# Patient Record
Sex: Male | Born: 1955
Health system: Midwestern US, Community
[De-identification: ages and names within clinical notes are randomized; demographics above are authoritative.]

## PROBLEM LIST (undated history)

## (undated) DIAGNOSIS — M109 Gout, unspecified: Secondary | ICD-10-CM

## (undated) DIAGNOSIS — E079 Disorder of thyroid, unspecified: Secondary | ICD-10-CM

## (undated) DIAGNOSIS — I10 Essential (primary) hypertension: Secondary | ICD-10-CM

## (undated) HISTORY — DX: Disorder of thyroid, unspecified: E07.9

## (undated) HISTORY — DX: Gout, unspecified: M10.9

## (undated) HISTORY — DX: Essential (primary) hypertension: I10

## (undated) HISTORY — PX: APPENDECTOMY: SHX54

---

## 2009-04-22 LAB — DRUG SCREEN UR - NO CONFIRM
ACETAMINOPHEN: NEGATIVE
AMPHETAMINES: NEGATIVE
BARBITURATES: NEGATIVE
BENZODIAZEPINES: NEGATIVE
COCAINE: POSITIVE — AB
METHADONE: NEGATIVE
Methamphetamines: NEGATIVE
OPIATES: NEGATIVE
PCP(PHENCYCLIDINE): NEGATIVE
THC (TH-CANNABINOL): POSITIVE — AB
TRICYCLICS: NEGATIVE

## 2017-04-18 ENCOUNTER — Ambulatory Visit (HOSPITAL_COMMUNITY)
Admission: RE | Admit: 2017-04-18 | Discharge: 2017-04-18 | Disposition: A | Discharge status: Home / Self Care | Payer: Medicare HMO | Source: Ambulatory Visit | Attending: Family Medicine | Admitting: Family Medicine

## 2017-04-18 ENCOUNTER — Other Ambulatory Visit: Payer: Self-pay

## 2017-04-18 ENCOUNTER — Ambulatory Visit: Payer: Medicare HMO | Admitting: Family Medicine

## 2017-04-18 ENCOUNTER — Encounter: Payer: Self-pay | Admitting: Family Medicine

## 2017-04-18 VITALS — BP 140/80 | HR 107 | Temp 98.3°F | Resp 16 | Ht 70.0 in | Wt 145.2 lb

## 2017-04-18 DIAGNOSIS — J189 Pneumonia, unspecified organism: Secondary | ICD-10-CM

## 2017-04-18 DIAGNOSIS — R5383 Other fatigue: Secondary | ICD-10-CM

## 2017-04-18 DIAGNOSIS — E0590 Thyrotoxicosis, unspecified without thyrotoxic crisis or storm: Secondary | ICD-10-CM | POA: Diagnosis not present

## 2017-04-18 DIAGNOSIS — R7989 Other specified abnormal findings of blood chemistry: Secondary | ICD-10-CM | POA: Diagnosis not present

## 2017-04-18 DIAGNOSIS — Z79899 Other long term (current) drug therapy: Secondary | ICD-10-CM | POA: Diagnosis not present

## 2017-04-18 DIAGNOSIS — Z87891 Personal history of nicotine dependence: Secondary | ICD-10-CM | POA: Insufficient documentation

## 2017-04-18 DIAGNOSIS — Z7289 Other problems related to lifestyle: Secondary | ICD-10-CM

## 2017-04-18 DIAGNOSIS — M1A09X1 Idiopathic chronic gout, multiple sites, with tophus (tophi): Secondary | ICD-10-CM | POA: Diagnosis not present

## 2017-04-18 DIAGNOSIS — Z789 Other specified health status: Secondary | ICD-10-CM | POA: Diagnosis not present

## 2017-04-18 DIAGNOSIS — R634 Abnormal weight loss: Secondary | ICD-10-CM

## 2017-04-18 NOTE — Progress Notes (Signed)
Patient ID: Jeremy Durham, male    DOB: 06/22/1955, 61 y.o.   MRN: [id number]  Chief Complaint  Patient presents with  . Thyroid Problem    Allergies Patient has no known allergies.  Subjective:   Jeremy LoweMichael Durham is a 61 y.o. male who presents to [facility] today.  HPI Mr. Jeremy Fushomas presents today as a new patient visit.  He is accompanied by his wife and his daughter.  They report that they are transferring their care to our office because they have some concerns about the care he has been receiving.  Reports that many of their family members come to our office for treatment.  Wife reports that in August of this year that patient got pneumonia.  He reports that he lost weight and had a decreased appetite.  No chest x-ray was obtained but reports they were told that he had walking pneumonia.  Reports he was in the bed and sick and then eventually got better.  Reports that again in October, he got sick again with SOB, DOE, and had gout in feet, knee, arm.  They went back to the doctor and at that time the physician was concerned that he could have pneumonia and checked labs.  Was subsequently put on prednisone for a week. Went back to doctor and was told kidney was not good/thyroid was abnormal. Went back and got more blood work. Then told kidneys were okay and thyroid was still abnormal.  Were referred to endocrinologist for evaluation of thyroid and given appointment for July 2019.  Report that they came in today because they are worried about patient's overall health.  Reports that in less than a year that he has lost over 50 pounds.  Appetite has been lower than normal.  Patient does drink alcohol, and reports he drinks about a quart a day.  Denies any liquor use.  Denies any illicit drug use.  Is a prior tobacco smoker.  Used to drink heavier than he does now.  Asked about the color of his eyes patient and his family report that his eyes will get more yellow from time to time but  they are more yellow now used to be in the past.  Patient denies any cough.  Reports when he does yard work he does get a little winded from time to time.  Reports that he does eat about 2 meals a day but is not always hungry.  Denies any chest pain or swelling in his legs.   Review [facility] that are in chart reveal a suppressed TSH with all other normal thyroid functions.  Patient gives a significant history of gout in his past.  He currently takes allopurinol each day for gout.  He reports for gout attacks he is used steroids and colchicine in the past.  He reports he has had gout in his feet, knees, arms, hands, and ankles in the past.  Has never been on any other gout medications.  Has never seen a rheumatologist for his gout.  Reports that his joints will drain whitish discharge with the gout.  Tries not to eat beef or meat products because of his gout.  Patient reports that has deformities in his hands because of the gout.    Past Medical History:  Diagnosis Date  . Gout   . Hypertension   . Thyroid disease     Past Surgical History:  Procedure Laterality Date  . APPENDECTOMY  Family History  Problem Relation Age of Onset  . Hypertension Mother   . Hypertension Father   . Hypertension Sister   . Diabetes Sister   . Hypertension Daughter   . Hypertension Sister   . Hypertension Sister   . Hypertension Sister      Social History   Socioeconomic History  . Marital status: Married    Spouse name: None  . Number of children: None  . Years of education: None  . Highest education level: None  Social Needs  . Financial resource strain: None  . Food insecurity - worry: None  . Food insecurity - inability: None  . Transportation needs - medical: None  . Transportation needs - non-medical: None  Occupational History  . None  Tobacco Use  . Smoking status: Former Smoker    Packs/day: 1.00    Years: 15.00    Pack years: 15.00  . Smokeless  tobacco: Never Used  Substance and Sexual Activity  . Alcohol use: Yes  . Drug use: No  . Sexual activity: None  Other Topics Concern  . None  Social History Narrative   Grew up in [place].   Married.   One daughter.    On disability for gout.    Wear seatbelt.   Drives.    Review of Systems  Constitutional: Positive for appetite change, fatigue and unexpected weight change. Negative for activity change, chills, diaphoresis and fever.  HENT: Negative for congestion, sinus pain, trouble swallowing and voice change.   Eyes: Negative for visual disturbance.  Respiratory: Negative for apnea, cough, choking, chest tightness, wheezing and stridor.   Cardiovascular: Negative for chest pain, palpitations and leg swelling.  Gastrointestinal: Negative for abdominal distention, abdominal pain, anal bleeding, blood in stool, constipation, diarrhea and nausea.  Endocrine: Negative for polydipsia, polyphagia and polyuria.  Genitourinary: Negative for dysuria, frequency, hematuria and urgency.  Musculoskeletal: Positive for arthralgias. Negative for neck pain and neck stiffness.  Skin: Negative for rash.  Allergic/Immunologic: Negative for immunocompromised state.  Neurological: Negative for dizziness, tremors, seizures, syncope, speech difficulty, weakness and light-headedness.  Hematological: Negative for adenopathy. Does not bruise/bleed easily.  Psychiatric/Behavioral: Negative for behavioral problems, dysphoric mood, sleep disturbance and suicidal ideas.     Objective:   BP 140/80 (BP Location: Left Arm, Patient Position: Sitting, Cuff Size: Normal)   Pulse (!) 107   Temp 98.3 F (36.8 C) (Other (Comment))   Resp 16   Ht 5\' 10"  (1.778 m)   Wt 145 lb 4 oz (65.9 kg)   SpO2 97%   BMI 20.84 kg/m   Physical Exam  Constitutional: He is oriented to person, place, and time. He appears well-developed. No distress.  Thin African-American male with poor dentition.  Speech hard to  understand at times.  HENT:  Head: Normocephalic and atraumatic.  Nose: Nose normal.  Mouth/Throat: Oropharynx is clear and moist. No oropharyngeal exudate.  Eyes: EOM are normal. Pupils are equal, round, and reactive to light.  Muddy sclera, uncertain as to whether is icteric or patient's normal.  Neck: Normal range of motion. Neck supple. No JVD present. No tracheal deviation present. No thyromegaly present.  Cardiovascular: Normal rate, regular rhythm, normal heart sounds and intact distal pulses.  Heart rate 86  Pulmonary/Chest: Effort normal and breath sounds normal. No respiratory distress. He has no wheezes.  Abdominal: Soft. Bowel sounds are normal. He exhibits no distension. There is no tenderness. There is no guarding.  Scar on right upper quadrant.  Thin abdomen.  No hepatosplenomegaly palpated.  Musculoskeletal: Normal range of motion. He exhibits no edema.  Patient with bilateral hand PIP and DIP joint swelling and deformities.  Left knee swelling and effusion present.  Lymphadenopathy:    He has no cervical adenopathy.  Neurological: He is alert and oriented to person, place, and time. No cranial nerve deficit.  Skin: Skin is warm and dry. He is not diaphoretic.  Psychiatric: He has a normal mood and affect. His behavior is normal. Judgment and thought content normal.     Assessment and Plan   1. Fatigue, unspecified type Uncertain etiology whether related anemia or other metabolic disorder.  Check labs. - CBC with Differential/Platelet  2. Weight loss Uncertain at this time with her weight loss is secondary to inadequate caloric intake versus hypermetabolic state like thyroid, recovering from pneumonia versus possible malignancy or infectious disease.  Check labs and other workup as indicated.  Patient asked to keep a diet diary. - CBC with Differential/Platelet - COMPLETE METABOLIC PANEL WITH GFR - Hepatitis panel, acute - HIV antibody  3. Alcohol use She has cut  down on his alcohol use.  Recommend he start a multivitamin each day as directed.  Alcohol abstinence for his overall health was encouraged.  He gives no history that if he does not drink he gets shaky or has side effects.  Can go days without drinking.  Check labs. - Vitamin B12 - RBC Folate Patient with history of intermittent scleral icterus, and alcohol use.  Check liver function at this time. 4. Elevated serum creatinine Notes reviewed in chart reported elevated serum creatinine, after talking with patient questionable in the setting of acute renal failure secondary to dehydration when he was sick.  Check labs today. - Basic metabolic panel  5. Idiopathic chronic gout of multiple sites with tophus Patient was significant tophaceous gout at multiple sites and arthritic changes as a result.  Continue allopurinol.  Information given on low purine diet.  Counseled to decrease alcohol use. Family and patient defer referral to rheumatology at this time but would rather wait on other lab test and follow-up. 6. High risk medication use   7. Pneumonia due to infectious organism, unspecified laterality, unspecified part of lung Patient with reported episodes of pneumonia and significant tobacco history.  Check chest x-ray at this time for resolution of pneumonia.  In addition will discuss CT scan of the chest to screen for lung cancer pending other lab tests and x-ray. - DG Chest 2 View; Future  8. Hyperthyroidism Lab values discussed with patient and family that were in chart.  We will check these additional tests today since it is been approximately 2 months and testing was done. - TSH - T3, free - T4  Patient requests that we call his wife, Ricki MillerLinda Poellnitz,  [phone]/ [phone] with lab test results. Office visit was greater than 45 minutes.  Greater than 50% of office visit spent counseling and coordinating care. Patient defers pneumonia and influenza vaccinations.  Counseled concerning  the benefits of these immunizations. Return in about 3 weeks (around 05/09/2017) for Follow-up. Aliene Beamsachel Hagler, MD 04/18/2017

## 2017-04-18 NOTE — Patient Instructions (Signed)

## 2017-04-19 LAB — HEPATITIS PANEL, ACUTE
HEP B S AG: NONREACTIVE
Hep A IgM: NONREACTIVE
Hep B C IgM: NONREACTIVE
Hepatitis C Ab: NONREACTIVE
SIGNAL TO CUT-OFF: 0.02 (ref <1.00)

## 2017-04-19 LAB — CBC WITH DIFFERENTIAL/PLATELET
BASOS PCT: 0.6 %
Basophils Absolute: 43 cells/uL (ref 0–200)
EOS PCT: 1.5 %
Eosinophils Absolute: 108 cells/uL (ref 15–500)
HEMATOCRIT: 32.5 % — AB (ref 38.5–50.0)
HEMOGLOBIN: 11 g/dL — AB (ref 13.2–17.1)
LYMPHS ABS: 2362 {cells}/uL (ref 850–3900)
MCH: 29.5 pg (ref 27.0–33.0)
MCHC: 33.8 g/dL (ref 32.0–36.0)
MCV: 87.1 fL (ref 80.0–100.0)
MONOS PCT: 11.3 %
MPV: 8.9 fL (ref 7.5–12.5)
NEUTROS ABS: 3874 {cells}/uL (ref 1500–7800)
Neutrophils Relative %: 53.8 %
Platelets: 213 10*3/uL (ref 140–400)
RBC: 3.73 10*6/uL — ABNORMAL LOW (ref 4.20–5.80)
RDW: 17.8 % — ABNORMAL HIGH (ref 11.0–15.0)
Total Lymphocyte: 32.8 %
WBC mixed population: 814 cells/uL (ref 200–950)
WBC: 7.2 10*3/uL (ref 3.8–10.8)

## 2017-04-19 LAB — COMPLETE METABOLIC PANEL WITH GFR
AG Ratio: 1 (calc) (ref 1.0–2.5)
ALBUMIN MSPROF: 3.7 g/dL (ref 3.6–5.1)
ALKALINE PHOSPHATASE (APISO): 77 U/L (ref 40–115)
ALT: 5 U/L — ABNORMAL LOW (ref 9–46)
AST: 21 U/L (ref 10–35)
BUN / CREAT RATIO: 7 (calc) (ref 6–22)
BUN: 9 mg/dL (ref 7–25)
CALCIUM: 9.2 mg/dL (ref 8.6–10.3)
CO2: 22 mmol/L (ref 20–32)
CREATININE: 1.28 mg/dL — AB (ref 0.70–1.25)
Chloride: 104 mmol/L (ref 98–110)
GFR, EST AFRICAN AMERICAN: 69 mL/min/{1.73_m2} (ref >=60)
GFR, EST NON AFRICAN AMERICAN: 60 mL/min/{1.73_m2} (ref >=60)
GLOBULIN: 3.6 g/dL (ref 1.9–3.7)
GLUCOSE: 77 mg/dL (ref 65–99)
Potassium: 3.4 mmol/L — ABNORMAL LOW (ref 3.5–5.3)
Sodium: 141 mmol/L (ref 135–146)
TOTAL PROTEIN: 7.3 g/dL (ref 6.1–8.1)
Total Bilirubin: 0.5 mg/dL (ref 0.2–1.2)

## 2017-04-19 LAB — T3, FREE: T3, Free: 3.8 pg/mL (ref 2.3–4.2)

## 2017-04-19 LAB — HIV ANTIBODY (ROUTINE TESTING W REFLEX): HIV: NONREACTIVE

## 2017-04-19 LAB — VITAMIN B12: VITAMIN B 12: 314 pg/mL (ref 200–1100)

## 2017-04-19 LAB — T4: T4, Total: 9.5 ug/dL (ref 4.9–10.5)

## 2017-04-19 LAB — FOLATE RBC: RBC Folate: 629 ng/mL RBC (ref >280)

## 2017-04-19 LAB — TSH: TSH: 0.02 m[IU]/L — AB (ref 0.40–4.50)

## 2017-04-20 ENCOUNTER — Other Ambulatory Visit: Payer: Self-pay | Admitting: Family Medicine

## 2017-04-20 DIAGNOSIS — D649 Anemia, unspecified: Secondary | ICD-10-CM

## 2017-04-20 DIAGNOSIS — R634 Abnormal weight loss: Secondary | ICD-10-CM

## 2017-04-20 MED ORDER — POTASSIUM CHLORIDE ER 10 MEQ PO CPCR: 10.0000 meq | ORAL_CAPSULE | Freq: Two times a day (BID) | ORAL | 0 refills | Status: DC

## 2017-04-20 NOTE — Telephone Encounter (Signed)
Spoke to patient's wife. Informed her of all below. She will call back to schedule.

## 2017-04-20 NOTE — Telephone Encounter (Signed)
Please call and speak with patient's wife and advise:  1.  Patient is anemic with a hemoglobin of 11.  I have ordered some additional test.  He can return to the lab to get this done.  In addition I have placed referral to gastroenterology to check for bleeding source and to evaluate why he is loosing weight.   2.  Patient's potassium was low at 3.4.  I have called in a potassium medication that he will need to take on a daily basis and we will plan on rechecking his level of follow-up. I have ordered the potassium as a call in, b/c he did not have a pharmacy listed.    3.  Please advised that HIV and hepatitis A, B, and C testing were negative.  4.  Please advised that his B12 is low.  Start B12 1000 mcg a day over-the-counter.  5.  Thyroid tests are consistent with what we discussed at the office visit.  There is no indication for any additional thyroid tests at this time and we will plan on rechecking those in 6 months.   6.  Please tell patient's wife that this is a lot of information and if they would like to come in sooner for their scheduled visit to discuss and get the additional labs that this would be fine.  Janine Limboachel H. Tracie HarrierHagler, MD

## 2017-04-26 ENCOUNTER — Telehealth: Payer: Self-pay | Admitting: Family Medicine

## 2017-04-26 NOTE — Telephone Encounter (Signed)
Someone was going to call in some medicine to [facility] in [place]. Pt went to pick it up and it was not there --please advise [phone]

## 2017-04-27 MED ORDER — POTASSIUM CHLORIDE ER 10 MEQ PO CPCR: 10.0000 meq | ORAL_CAPSULE | Freq: Two times a day (BID) | ORAL | 0 refills | Status: AC

## 2017-04-27 NOTE — Telephone Encounter (Signed)
Per wife, potassium was not received by pharm. Resent

## 2017-04-27 NOTE — Telephone Encounter (Signed)
I don't see anything in note.

## 2017-04-27 NOTE — Telephone Encounter (Signed)
Please call patient's wife, her number is in the note, and find out what they are requesting and need. Thank you. Janine Limboachel H. Tracie HarrierHagler, MD

## 2017-04-28 ENCOUNTER — Telehealth: Payer: Self-pay | Admitting: Family Medicine

## 2017-04-28 NOTE — Telephone Encounter (Signed)
Verbal given 

## 2017-04-28 NOTE — Telephone Encounter (Signed)
Patient called in to let you know that [facility] does not have her medication that was sent in yesterday Cb#: [phone]

## 2017-05-11 ENCOUNTER — Ambulatory Visit (INDEPENDENT_AMBULATORY_CARE_PROVIDER_SITE_OTHER): Payer: Medicare HMO | Admitting: Internal Medicine

## 2017-05-11 ENCOUNTER — Encounter (INDEPENDENT_AMBULATORY_CARE_PROVIDER_SITE_OTHER): Payer: Self-pay | Admitting: Internal Medicine

## 2017-05-11 ENCOUNTER — Encounter (INDEPENDENT_AMBULATORY_CARE_PROVIDER_SITE_OTHER): Payer: Self-pay | Admitting: *Deleted

## 2017-05-11 VITALS — BP 180/90 | HR 120 | Temp 98.0°F | Ht 66.0 in | Wt 141.0 lb

## 2017-05-11 DIAGNOSIS — D508 Other iron deficiency anemias: Secondary | ICD-10-CM

## 2017-05-11 DIAGNOSIS — K7689 Other specified diseases of liver: Secondary | ICD-10-CM

## 2017-05-11 DIAGNOSIS — R634 Abnormal weight loss: Secondary | ICD-10-CM

## 2017-05-11 NOTE — Patient Instructions (Signed)
Labs and US abdomen.  

## 2017-05-11 NOTE — Progress Notes (Signed)
   Subjective:    Patient ID: Jeremy Durham, male    DOB: 09/28/1955, 61 y.o.   MRN: [id number]  HPI Referred by Dr. Tracie HarrierHagler for anemia/weight loss. He states he has lost about 40-60 pounds over a 5 yr period. He was [facility] for [facility] last year [facility] Saw Dr Tracie HarrierHagler in November and was noted that he sclera was icteric. He drinks about a quart of beer and sometimes he drinks liquor.  His wife states when he has the gout, his skin gets dark and his eyes get yellow.  His appetite is okay. No recent weight loss. Has a BM usually daily. No acid reflux. No abdominal pain.  Never had a colonoscopy in the past.  04/18/2017 Acute Hepatitis Panel negative.  Vitamin B12 314.   CBC    Component Value Date/Time   WBC 7.2 04/18/2017 1227   RBC 3.73 (L) 04/18/2017 1227   HGB 11.0 (L) 04/18/2017 1227   HCT 32.5 (L) 04/18/2017 1227   PLT 213 04/18/2017 1227   MCV 87.1 04/18/2017 1227   MCH 29.5 04/18/2017 1227   MCHC 33.8 04/18/2017 1227   RDW 17.8 (H) 04/18/2017 1227   LYMPHSABS 2,362 04/18/2017 1227   EOSABS 108 04/18/2017 1227   BASOSABS 43 04/18/2017 1227   CMP Latest Ref Rng & Units 04/18/2017  Glucose 65 - 99 mg/dL 77  BUN 7 - 25 mg/dL 9  Creatinine 4.090.70 - 8.111.25 mg/dL 9.14(N1.28(H)  Sodium 829135 - 562146 mmol/L 141  Potassium 3.5 - 5.3 mmol/L 3.4(L)  Chloride 98 - 110 mmol/L 104  CO2 20 - 32 mmol/L 22  Calcium 8.6 - 10.3 mg/dL 9.2  Total Protein 6.1 - 8.1 g/dL 7.3  Total Bilirubin 0.2 - 1.2 mg/dL 0.5  AST 10 - 35 U/L 21  ALT 9 - 46 U/L 5(L)     Review of Systems Past Medical History:  Diagnosis Date  . Gout   . Hypertension   . Thyroid disease     Past Surgical History:  Procedure Laterality Date  . APPENDECTOMY      No Known Allergies  Current Outpatient Medications on File Prior to Visit  Medication Sig Dispense Refill  . allopurinol (ZYLOPRIM) 300 MG tablet Take by mouth.    Marland Kitchen. amLODipine (NORVASC) 10 MG tablet Take by mouth.    . potassium  chloride (MICRO-K) 10 MEQ CR capsule Take 1 capsule (10 mEq total) by mouth 2 (two) times daily. 30 capsule 0   No current facility-administered medications on file prior to visit.         Objective:   Physical Exam Blood pressure (!) 180/90, pulse (!) 120, temperature 98 F (36.7 C), height 5\' 6"  (1.676 m), weight 141 lb (64 kg).  Alert and oriented. Skin warm and dry. Oral mucosa is moist.   . Sclera anicteric, conjunctivae is pink. Thyroid not enlarged. No cervical lymphadenopathy. Lungs clear. Heart regular rate and rhythm.  Abdomen is soft. Bowel sounds are positive. No hepatomegaly. No abdominal masses felt. No tenderness.  No edema to lower extremities.          Assessment & Plan:  Anemia. Am going to get iron studies. Will repeat an Hepatic function, AFP, PT/INR Jaundice: US abdomen After all are back, will set up for a colonoscopy. May need an EGD depending on iron studies.

## 2017-05-12 LAB — HEPATIC FUNCTION PANEL
AG Ratio: 1.1 (calc) (ref 1.0–2.5)
ALKALINE PHOSPHATASE (APISO): 63 U/L (ref 40–115)
ALT: 4 U/L — ABNORMAL LOW (ref 9–46)
AST: 16 U/L (ref 10–35)
Albumin: 3.6 g/dL (ref 3.6–5.1)
BILIRUBIN DIRECT: 0.1 mg/dL (ref 0.0–0.2)
BILIRUBIN TOTAL: 0.3 mg/dL (ref 0.2–1.2)
Globulin: 3.4 g/dL (calc) (ref 1.9–3.7)
Indirect Bilirubin: 0.2 mg/dL (calc) (ref 0.2–1.2)
Total Protein: 7 g/dL (ref 6.1–8.1)

## 2017-05-12 LAB — FERRITIN: Ferritin: 693 ng/mL — ABNORMAL HIGH (ref 20–380)

## 2017-05-12 LAB — PROTIME-INR
INR: 1
PROTHROMBIN TIME: 10.6 s (ref 9.0–11.5)

## 2017-05-12 LAB — AFP TUMOR MARKER: AFP-Tumor Marker: 1.6 ng/mL (ref <6.1)

## 2017-05-12 LAB — IRON, TOTAL/TOTAL IRON BINDING CAP
%SAT: 41 % (ref 15–60)
IRON: 74 ug/dL (ref 50–180)
TIBC: 181 ug/dL — AB (ref 250–425)

## 2017-05-15 ENCOUNTER — Other Ambulatory Visit: Payer: Self-pay | Admitting: Family Medicine

## 2017-05-15 NOTE — Telephone Encounter (Signed)
Patient was sent in #30 for BID dosing. Is patient to be on this medication more than 2 weeks?

## 2017-05-17 ENCOUNTER — Telehealth (INDEPENDENT_AMBULATORY_CARE_PROVIDER_SITE_OTHER): Payer: Self-pay | Admitting: Internal Medicine

## 2017-05-17 NOTE — Telephone Encounter (Signed)
Will schedule a colonoscopy once US abdomen is back. If he has cirrhosis may need and EGD with possible banding.

## 2017-05-18 NOTE — Telephone Encounter (Signed)
No.  He does not need to take this medicine for longer than 2 weeks.  He was supposed to follow-up and have his potassium checked when he comes back to the office.  Please advised him to keep her scheduled follow-up.

## 2017-05-25 ENCOUNTER — Ambulatory Visit (HOSPITAL_COMMUNITY): Payer: Medicare HMO

## 2017-05-29 ENCOUNTER — Telehealth (INDEPENDENT_AMBULATORY_CARE_PROVIDER_SITE_OTHER): Payer: Self-pay | Admitting: Internal Medicine

## 2017-05-29 NOTE — Telephone Encounter (Signed)
r 

## 2017-06-15 ENCOUNTER — Telehealth (INDEPENDENT_AMBULATORY_CARE_PROVIDER_SITE_OTHER): Payer: Self-pay | Admitting: Internal Medicine

## 2017-06-15 ENCOUNTER — Ambulatory Visit (HOSPITAL_COMMUNITY)
Admission: RE | Admit: 2017-06-15 | Discharge: 2017-06-15 | Disposition: A | Discharge status: Home / Self Care | Payer: Medicare HMO | Source: Ambulatory Visit | Attending: Internal Medicine | Admitting: Internal Medicine

## 2017-06-15 DIAGNOSIS — R17 Unspecified jaundice: Secondary | ICD-10-CM | POA: Insufficient documentation

## 2017-06-15 DIAGNOSIS — D508 Other iron deficiency anemias: Secondary | ICD-10-CM | POA: Insufficient documentation

## 2017-06-15 DIAGNOSIS — K7689 Other specified diseases of liver: Secondary | ICD-10-CM

## 2017-06-15 NOTE — Telephone Encounter (Signed)
I asked Jeremy Durham to come by the office and pick up stool cards. She was told to collect on 3 different days.

## 2017-06-26 ENCOUNTER — Telehealth (INDEPENDENT_AMBULATORY_CARE_PROVIDER_SITE_OTHER): Payer: Self-pay | Admitting: *Deleted

## 2017-06-26 ENCOUNTER — Telehealth (INDEPENDENT_AMBULATORY_CARE_PROVIDER_SITE_OTHER): Payer: Self-pay | Admitting: Internal Medicine

## 2017-06-26 NOTE — Telephone Encounter (Signed)
No answer. Will try later

## 2017-06-26 NOTE — Telephone Encounter (Signed)
The only phone number I see in this chart for this patient is [phone].   Daughter's number is [phone]

## 2017-06-26 NOTE — Telephone Encounter (Signed)
I have talked with wife. She will let me know when to schedule colonoscopy.

## 2017-06-26 NOTE — Telephone Encounter (Signed)
I have talked with wife. She is going to check on her insurance before a colonoscopy is scheduled. She will call me back and let me know when we can schedule.

## 2017-07-05 ENCOUNTER — Telehealth (INDEPENDENT_AMBULATORY_CARE_PROVIDER_SITE_OTHER): Payer: Self-pay | Admitting: Internal Medicine

## 2017-07-05 NOTE — Telephone Encounter (Signed)
err

## 2017-07-24 ENCOUNTER — Telehealth (INDEPENDENT_AMBULATORY_CARE_PROVIDER_SITE_OTHER): Payer: Self-pay | Admitting: Internal Medicine

## 2017-07-24 NOTE — Telephone Encounter (Signed)
err

## 2017-10-19 ENCOUNTER — Encounter: Payer: Self-pay | Admitting: Family Medicine

## 2017-10-20 ENCOUNTER — Encounter: Payer: Self-pay | Admitting: Family Medicine

## 2018-06-11 IMAGING — US US ABDOMEN COMPLETE
1 series · 14 of 25 positions shown · non-contrast
Comparison: None.

CLINICAL DATA: Iron deficiency anemia.  Jaundice.

EXAM:
ABDOMEN ULTRASOUND COMPLETE

[Series 1: us abdomen complete · 0.13mm/px · 14 of 122 slices shown]
[im 1/122]
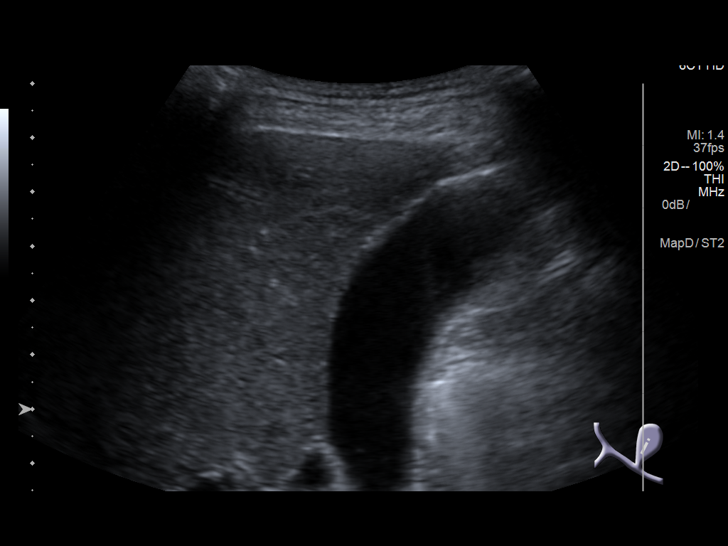
[im 11/122]
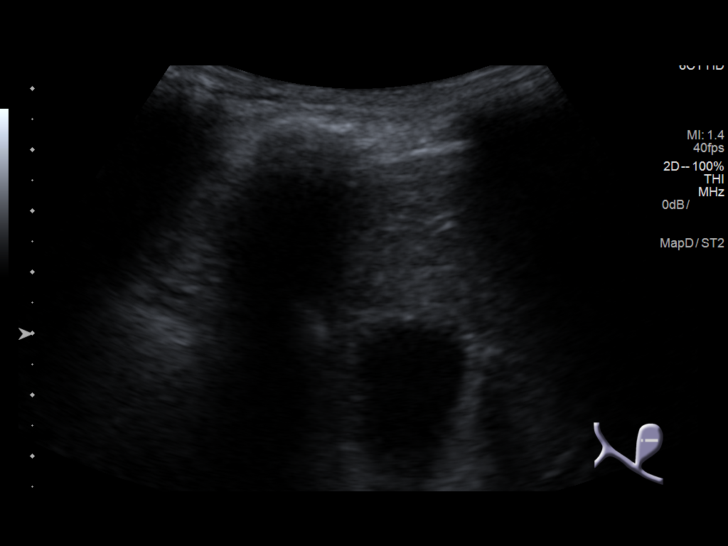
[im 21/122]
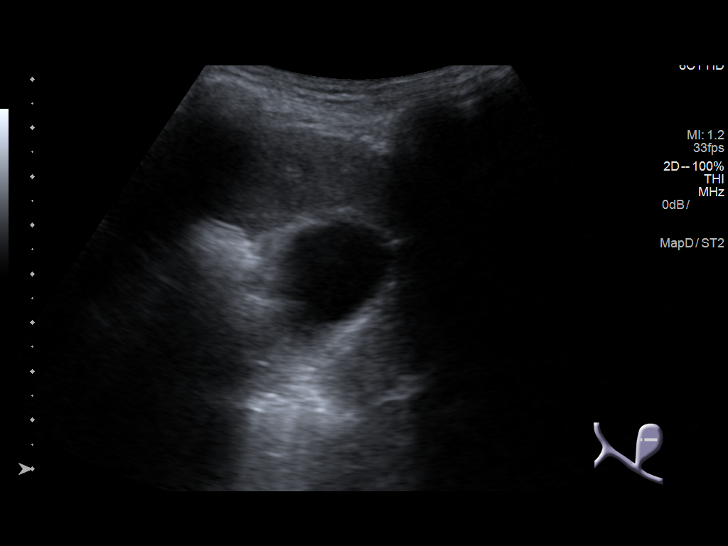
[im 31/122]
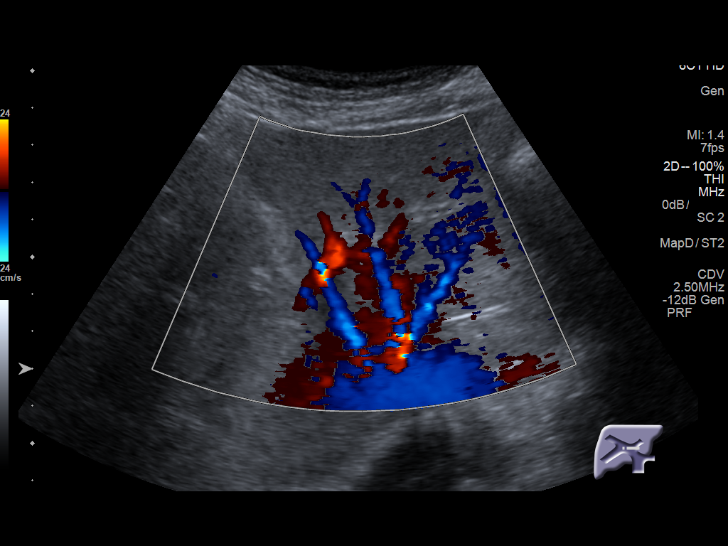
[im 41/122]
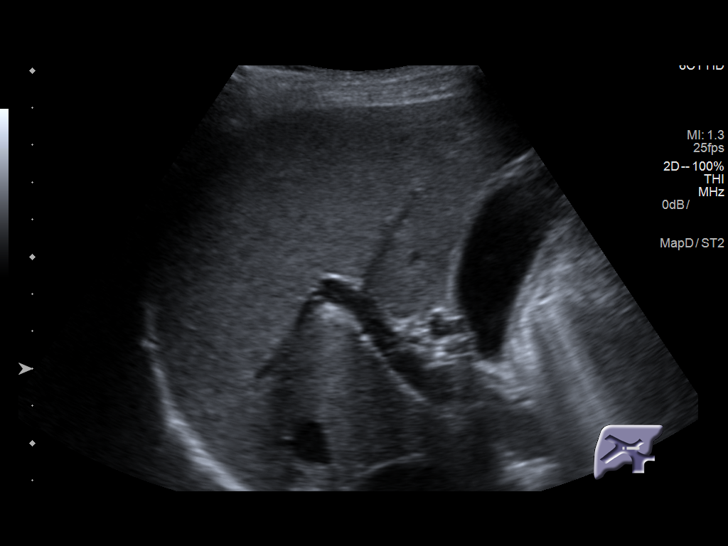
[im 46/122]
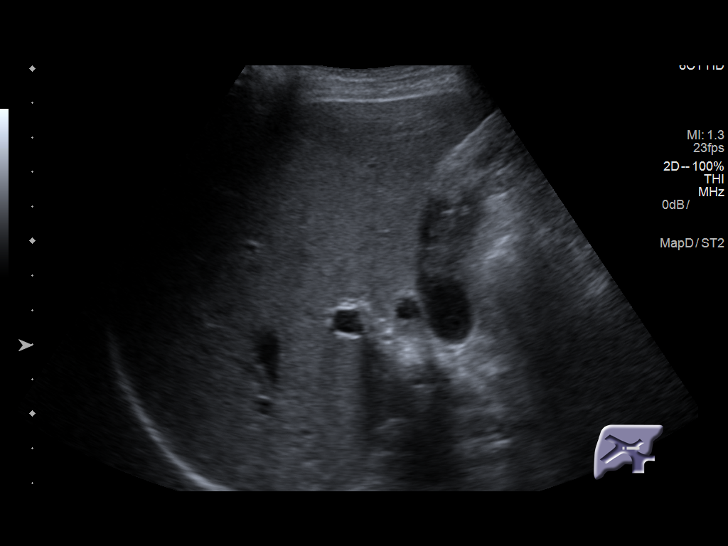
[im 56/122]
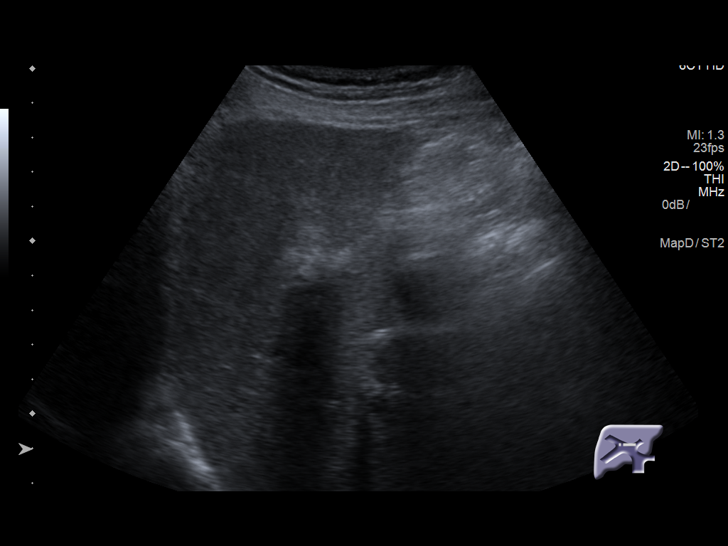
[im 66/122]
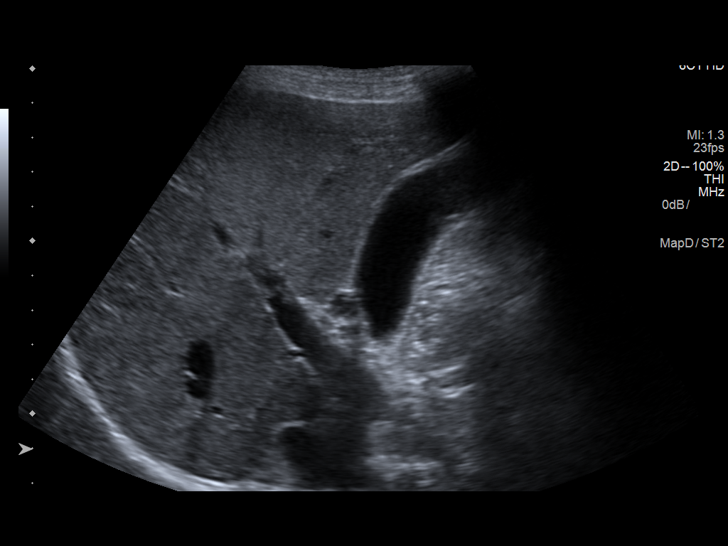
[im 76/122]
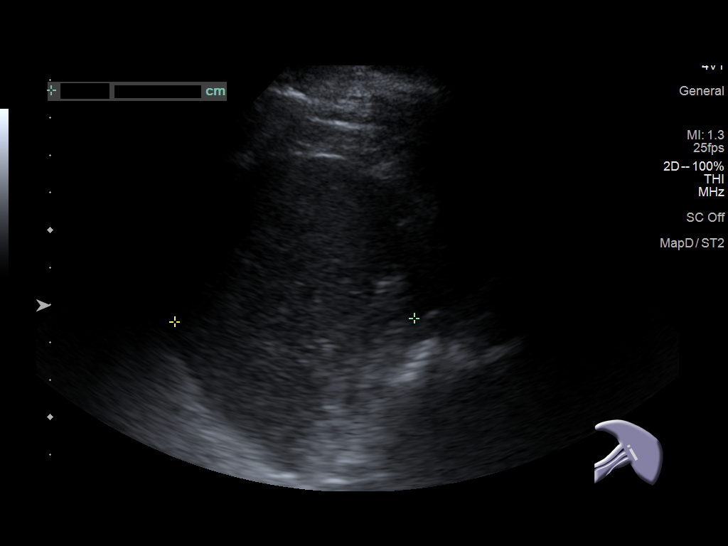
[im 81/122]
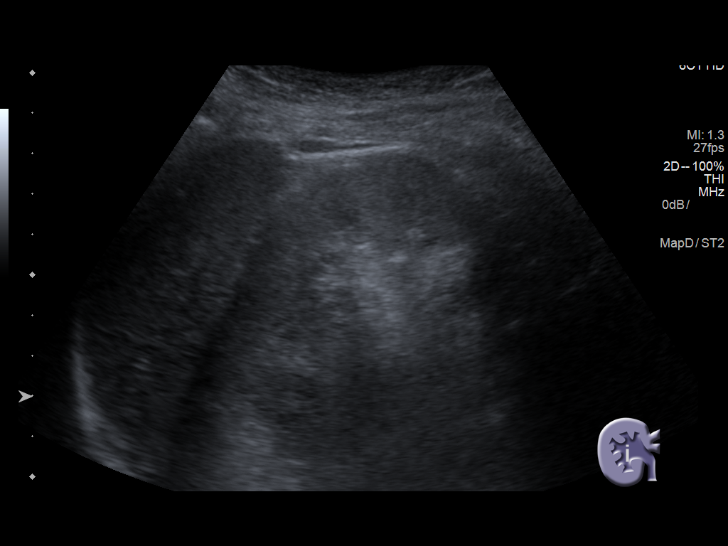
[im 91/122]
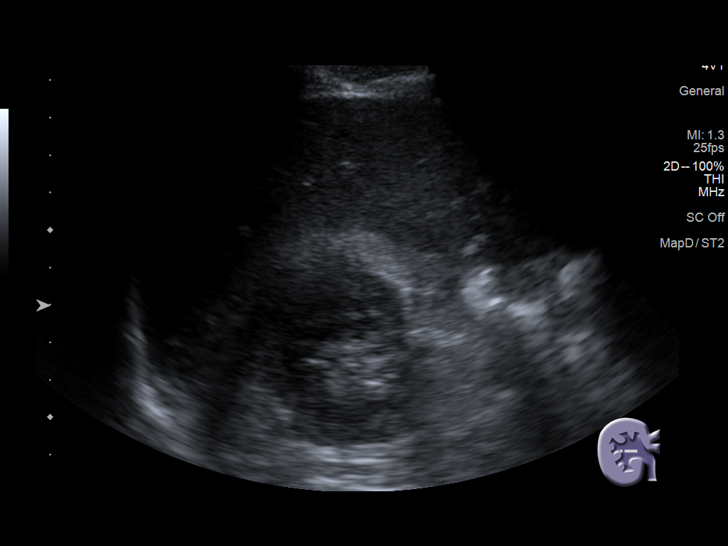
[im 101/122]
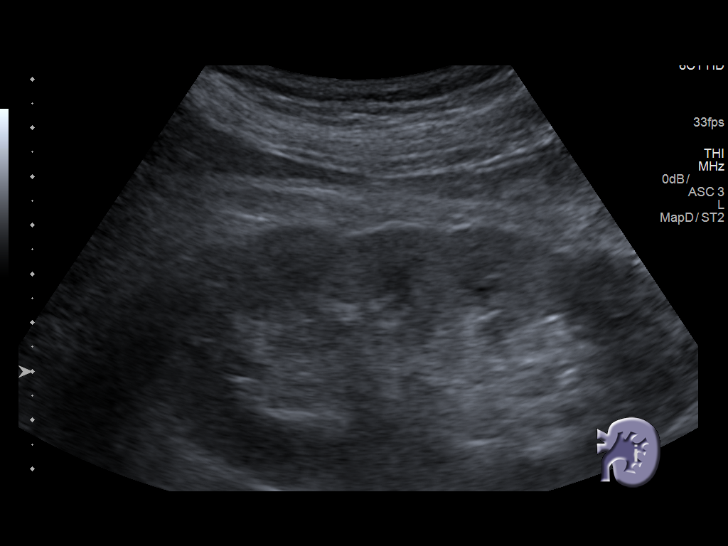
[im 111/122]
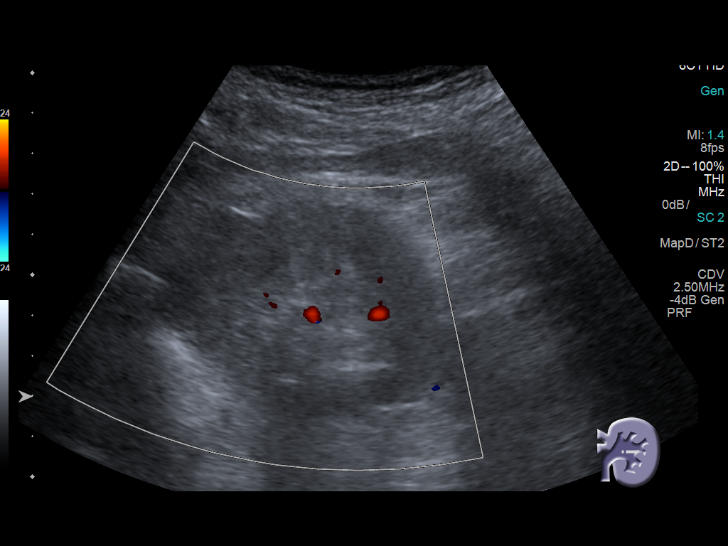
[im 122/122]
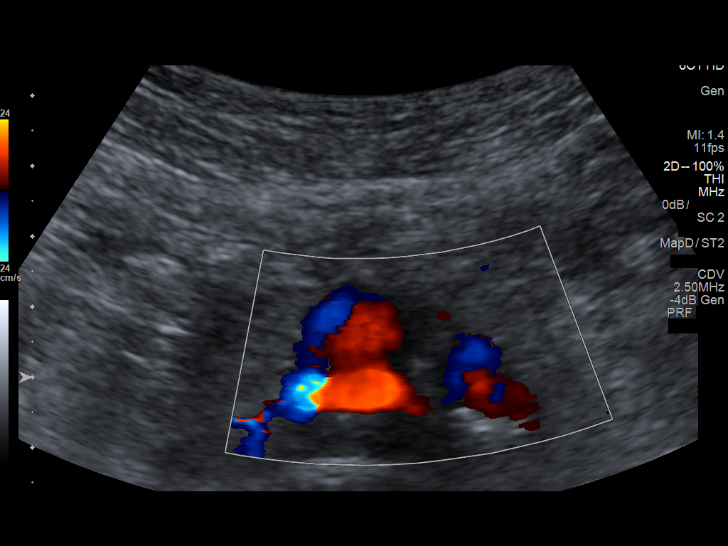

[14 of 25 positions shown; findings below may reference images not displayed]

FINDINGS: Gallbladder: No gallstones or wall thickening visualized. No
sonographic Murphy sign noted by sonographer.

Common bile duct: Diameter: 2.3 mm

Liver: No focal lesion identified. Within normal limits in
parenchymal echogenicity. Portal vein is patent on color Doppler
imaging with normal direction of blood flow towards the liver.

IVC: No abnormality visualized.

Pancreas: Visualized portion unremarkable.

Spleen: Size and appearance within normal limits.

Right Kidney: Length: 8.4 cm. Echogenicity within normal limits. No
mass or hydronephrosis visualized.

Left Kidney: Length: 11.1 cm. Echogenicity within normal limits. No
mass or hydronephrosis visualized.

Abdominal aorta: Not well visualized proximally due to shadowing
bowel gas.

Other findings: None.
IMPRESSION: 1. No cause for the patient's jaundice identified on this study. No
acute abnormalities.

## 2019-03-17 IMAGING — DX DG CHEST 2V
2 series · 2 of 2 positions shown · non-contrast
Comparison: None in PACs

CLINICAL DATA: Episode of pneumonia 2 months ago now with shortness
of breath. Former smoker.

EXAM:
CHEST  2 VIEW

[chest pa]
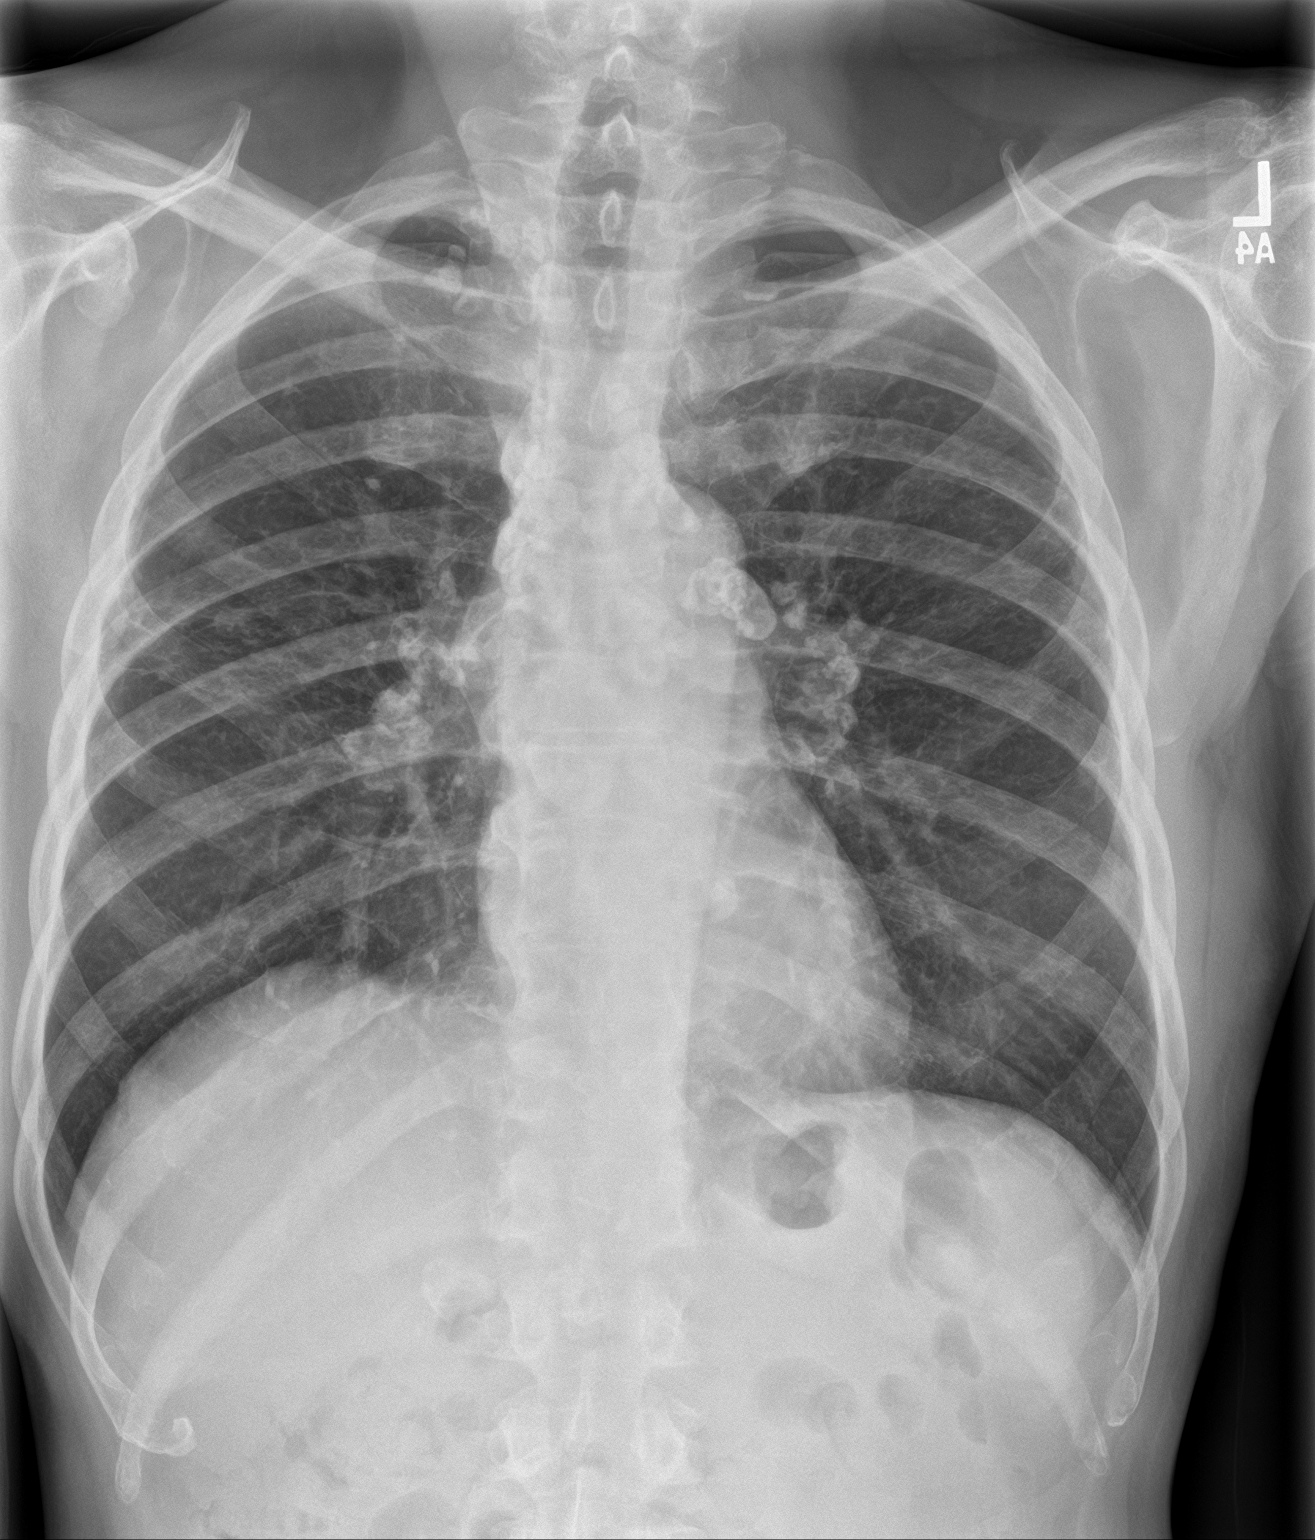

[chest lat]
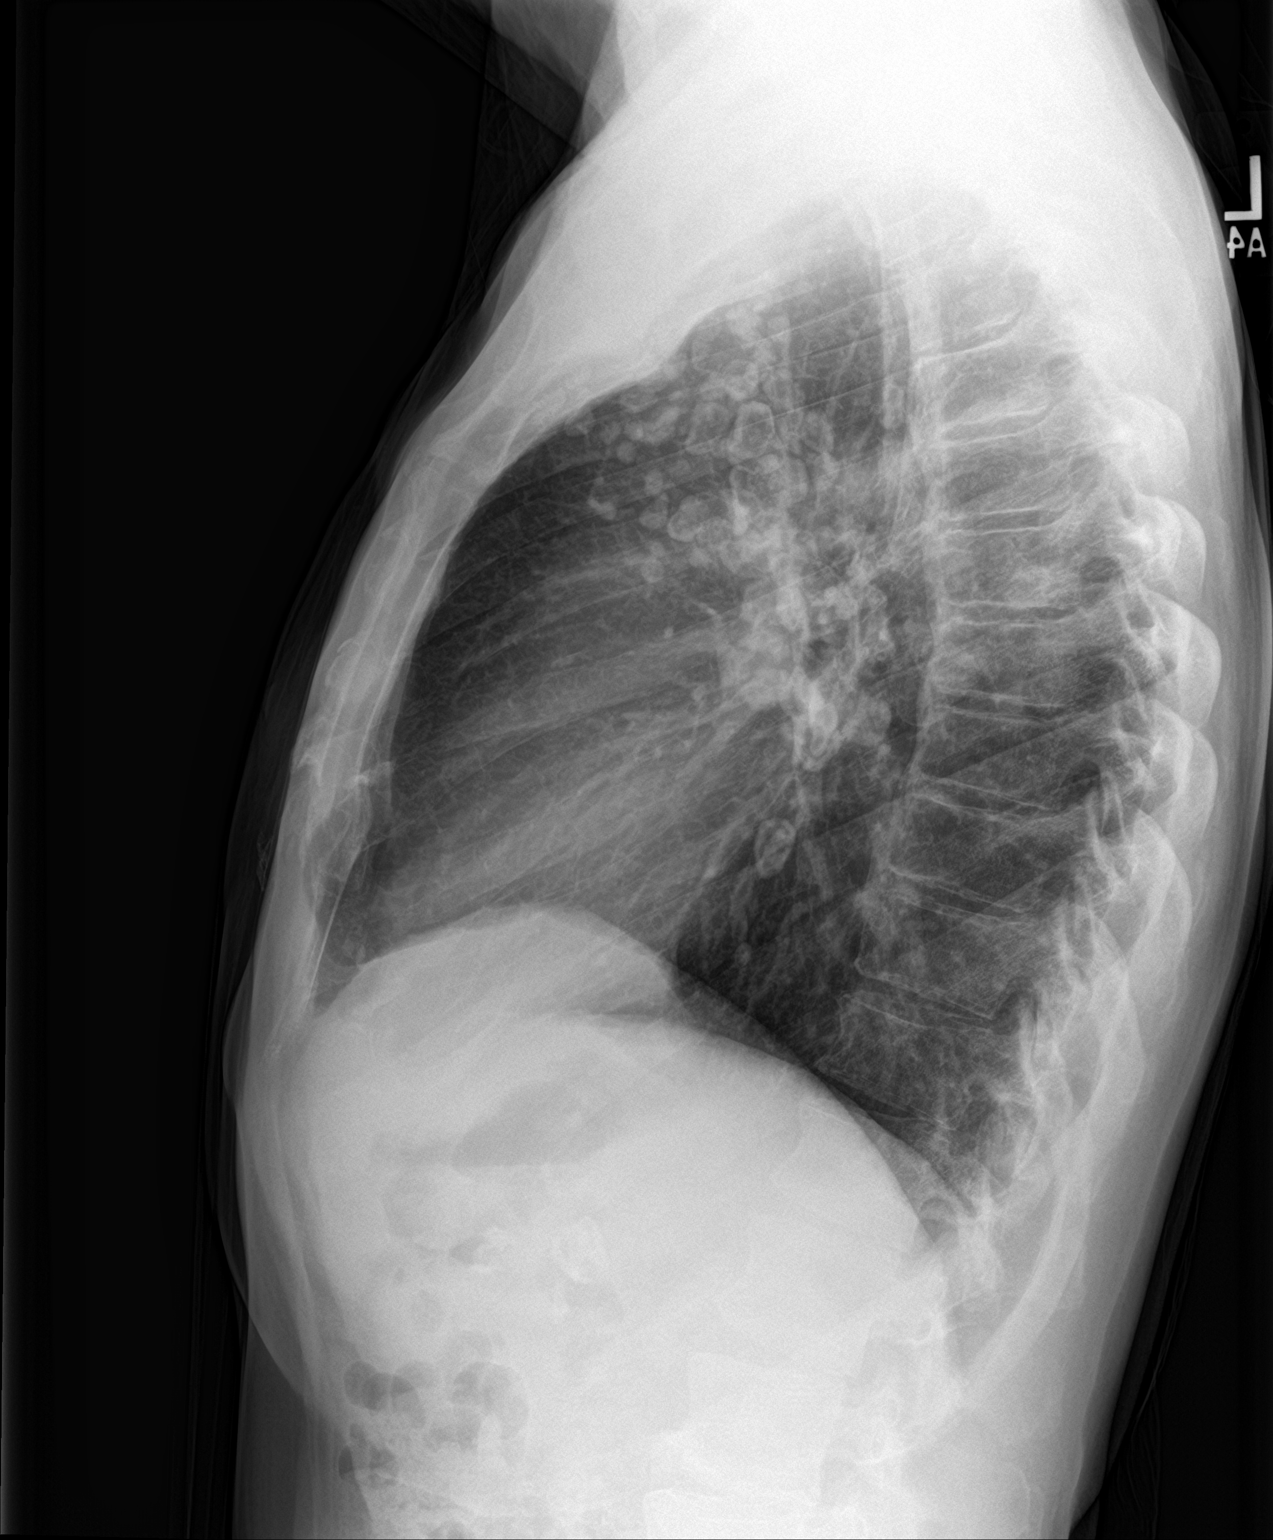

[2 of 2 positions shown; findings below may reference images not displayed]

FINDINGS: The lungs are adequately inflated. There is no focal infiltrate.
There are numerous calcified mediastinal and hilar lymph nodes with
parenchymal calcifications in the right upper lobe peripherally and
in the apex. The heart and pulmonary vascularity are normal. The
mediastinum is normal in width. There is no pleural effusion.
IMPRESSION: No acute pneumonia nor CHF. Mild interstitial prominence consistent
with the patient's smoking history.

Previous granulomatous infection.

## 2019-07-16 ENCOUNTER — Ambulatory Visit: Payer: Self-pay | Attending: Internal Medicine

## 2019-07-16 DIAGNOSIS — Z23 Encounter for immunization: Secondary | ICD-10-CM | POA: Insufficient documentation

## 2019-07-16 NOTE — Progress Notes (Signed)
   Covid-19 Vaccination Clinic  Name:  Jeremy Durham    MRN: [id number] DOB: 04-Aug-1955  07/16/2019  [name] was observed post Covid-19 immunization for 15 minutes without incident. He was provided with Vaccine Information Sheet and instruction to access the V-Safe system.   [name] was instructed to call 911 with any severe reactions post vaccine: Marland Kitchen Difficulty breathing  . Swelling of face and throat  . A fast heartbeat  . A bad rash all over body  . Dizziness and weakness   Immunizations Administered    Name Date Dose VIS Date Route   Moderna COVID-19 Vaccine 07/16/2019  2:38 PM 0.5 mL 04/16/2019 Intramuscular   Manufacturer: Moderna   Lot: [id number]   NDC: 32023-343-56

## 2019-08-13 ENCOUNTER — Ambulatory Visit: Payer: Self-pay | Attending: Internal Medicine

## 2019-08-13 DIAGNOSIS — Z23 Encounter for immunization: Secondary | ICD-10-CM

## 2019-08-13 NOTE — Progress Notes (Signed)
   Covid-19 Vaccination Clinic  Name:  Jeremy Durham    MRN: [id number] DOB: 06/03/1955  08/13/2019  Jeremy Durham was observed post Covid-19 immunization for 15 minutes without incident. He was provided with Vaccine Information Sheet and instruction to access the V-Safe system.   Jeremy Durham was instructed to call 911 with any severe reactions post vaccine: Marland Kitchen Difficulty breathing  . Swelling of face and throat  . A fast heartbeat  . A bad rash all over body  . Dizziness and weakness   Immunizations Administered    Name Date Dose VIS Date Route   Moderna COVID-19 Vaccine 08/13/2019 12:58 PM 0.5 mL 04/16/2019 Intramuscular   Manufacturer: Moderna   Lot: [id number]   NDC: 31121-624-46

## 2021-10-31 ENCOUNTER — Inpatient Hospital Stay
Admission: EM | Admit: 2021-10-31 | Discharge: 2021-11-06 | Discharge status: Against Medical Advice | Payer: PRIVATE HEALTH INSURANCE | Admitting: Internal Medicine

## 2021-10-31 DIAGNOSIS — A419 Sepsis, unspecified organism: Secondary | ICD-10-CM

## 2021-10-31 DIAGNOSIS — M6282 Rhabdomyolysis: Principal | ICD-10-CM

## 2021-10-31 DIAGNOSIS — T679XXA Effect of heat and light, unspecified, initial encounter: Secondary | ICD-10-CM

## 2021-10-31 LAB — CBC WITH AUTO DIFFERENTIAL
Absolute Immature Granulocyte: 0 10*3/uL (ref 0.00–0.04)
Basophils %: 0 % (ref 0–2)
Basophils Absolute: 0 10*3/uL (ref 0.0–0.1)
Eosinophils %: 0 % (ref 0–5)
Eosinophils Absolute: 0 10*3/uL (ref 0.0–0.4)
Hematocrit: 39.2 % (ref 36.0–48.0)
Hemoglobin: 13.3 g/dL (ref 13.0–16.0)
Immature Granulocytes: 0 % (ref 0.0–0.5)
Lymphocytes %: 6 % — ABNORMAL LOW (ref 21–52)
Lymphocytes Absolute: 0.8 10*3/uL — ABNORMAL LOW (ref 0.9–3.6)
MCH: 29.4 PG (ref 24.0–34.0)
MCHC: 33.9 g/dL (ref 31.0–37.0)
MCV: 86.7 FL (ref 78.0–100.0)
MPV: 10 FL (ref 9.2–11.8)
Monocytes %: 8 % (ref 3–10)
Monocytes Absolute: 1.1 10*3/uL (ref 0.05–1.2)
Neutrophils %: 86 % — ABNORMAL HIGH (ref 40–73)
Neutrophils Absolute: 11.8 10*3/uL — ABNORMAL HIGH (ref 1.8–8.0)
Nucleated RBCs: 0 PER 100 WBC
Platelets: 303 10*3/uL (ref 135–420)
RBC: 4.52 M/uL (ref 4.35–5.65)
RDW: 13.2 % (ref 11.6–14.5)
WBC: 13.7 10*3/uL — ABNORMAL HIGH (ref 4.6–13.2)
nRBC: 0 10*3/uL (ref 0.00–0.01)

## 2021-10-31 LAB — COMPREHENSIVE METABOLIC PANEL
ALT: 51 U/L (ref 16–61)
AST: 116 U/L — ABNORMAL HIGH (ref 10–38)
Albumin/Globulin Ratio: 0.9 (ref 0.8–1.7)
Albumin: 3.6 g/dL (ref 3.4–5.0)
Alk Phosphatase: 81 U/L (ref 45–117)
Anion Gap: 3 mmol/L (ref 3.0–18)
BUN: 14 MG/DL (ref 7.0–18)
Bun/Cre Ratio: 7 — ABNORMAL LOW (ref 12–20)
CO2: 24 mmol/L (ref 21–32)
Calcium: 9.4 MG/DL (ref 8.5–10.1)
Chloride: 116 mmol/L — ABNORMAL HIGH (ref 100–111)
Creatinine: 1.91 MG/DL — ABNORMAL HIGH (ref 0.6–1.3)
Est, Glom Filt Rate: 38 mL/min/{1.73_m2} — ABNORMAL LOW (ref >60)
Globulin: 4.1 g/dL — ABNORMAL HIGH (ref 2.0–4.0)
Glucose: 125 mg/dL — ABNORMAL HIGH (ref 74–99)
Potassium: 3.3 mmol/L — ABNORMAL LOW (ref 3.5–5.5)
Sodium: 143 mmol/L (ref 136–145)
Total Bilirubin: 0.4 MG/DL (ref 0.2–1.0)
Total Protein: 7.7 g/dL (ref 6.4–8.2)

## 2021-10-31 LAB — CK: Total CK: 5072 U/L — ABNORMAL HIGH (ref 39–308)

## 2021-10-31 MED ORDER — SODIUM CHLORIDE 0.9 % IV BOLUS
0.9 % | Freq: Once | INTRAVENOUS | Status: AC
Start: 2021-10-31 — End: 2021-10-31
  Administered 2021-10-31: 23:00:00 1000 mL via INTRAVENOUS

## 2021-10-31 MED ORDER — SODIUM CHLORIDE 0.9 % IV SOLN
0.9 % | INTRAVENOUS | Status: AC
Start: 2021-10-31 — End: 2021-11-02
  Administered 2021-11-01 – 2021-11-02 (×3): via INTRAVENOUS

## 2021-10-31 MED FILL — SODIUM CHLORIDE 0.9 % IV SOLN: 0.9 % | INTRAVENOUS | Qty: 1000

## 2021-10-31 NOTE — H&P (Signed)
HISTORY & PHYSICAL      Patient: Donald Haney MRN: [id number]  CSN: [id number]    Date of Birth: 15-May-1956  Age: 66 y.o.  Sex: male    DOA: 10/31/2021 LOS:  LOS: 0 days        DOA: 10/31/2021        Assessment/Plan     Principal Problem:    Rhabdomyolysis  Resolved Problems:    * No resolved hospital problems. *      Patient Active Problem List   Diagnosis    Rhabdomyolysis         Plan:  Rhabdomyolysis-continue IV fluids, started on bicarbonate gtt. in the ER, will follow CK levels.  AKI-likely prerenal, continue IV fluids, continue to monitor.  UTI-IV Rocephin, await urine culture reports to tailor antibiotics.  Homeless-Case management consultation for placement.  Bruising and abrasion on bilateral knees-wound care consultation in AM.  History of type 2 diabetes-check A1c, insulin sliding scale  DVT prophylaxis-Lovenox  Full code            History of Presenting Illness:  66 year old male with a history of type 2 diabetes, homeless presents to the emergency room after being found in the parking lot.  Patient is very weak, unable to give significant information.  ER evaluation-patient noted to have AKI, rhabdomyolysis, UTI, started on IV antibiotics, IV fluids, will be admitted to telemetry unit for further evaluation.        Past medical history-type 2 diabetes    No past surgical history on file.    No family history on file.    Social History     Socioeconomic History    Marital status: Divorced       Prior to Admission medications    Not on File       No Known Allergies    Review of Systems:    Review of systems could not be obtained from patient             Physical Exam:      Visit Vitals  BP (!) 153/93   Pulse (!) 114   Temp 99.6 F (37.6 C) (Rectal)   Resp 22   Ht 5' 10"  (1.778 m)   Wt 162 lb (73.5 kg)   SpO2 97%   BMI 23.24 kg/m       Physical Exam:    Gen: In general, this is a poorly nourished male in no acute distress  HEENT: Sclerae nonicteric. Oral mucous membranes moist. Dentition poor  Neck:  Supple with midline trachea.   CV: RRR without murmur or rub appreciated.   Resp:Respirations are unlabored without use of accessory muscles. Lung fields B/L without wheezes or rhonchi.   Abd: Soft, nontender, nondistended.  Extrem: Extremities are warm, without cyanosis or clubbing.  No pitting pretibial edema.  Patient has abrasions on both knees  Skin: Warm, no visible rashes.  Neuro: Patient is alert, oriented, and cooperative. No obvious focal defects. Moves all 4 extremities.    Labs Reviewed:    Recent Results (from the past 24 hour(s))   CBC with Auto Differential    Collection Time: 10/31/21  5:47 PM   Result Value Ref Range    WBC 13.7 (H) 4.6 - 13.2 K/uL    RBC 4.52 4.35 - 5.65 M/uL    Hemoglobin 13.3 13.0 - 16.0 g/dL    Hematocrit 39.2 36.0 - 48.0 %    MCV 86.7 78.0 - 100.0 FL    MCH  29.4 24.0 - 34.0 PG    MCHC 33.9 31.0 - 37.0 g/dL    RDW 13.2 11.6 - 14.5 %    Platelets 303 135 - 420 K/uL    MPV 10.0 9.2 - 11.8 FL    Nucleated RBCs 0.0 0 PER 100 WBC    nRBC 0.00 0.00 - 0.01 K/uL    Neutrophils % 86 (H) 40 - 73 %    Lymphocytes % 6 (L) 21 - 52 %    Monocytes % 8 3 - 10 %    Eosinophils % 0 0 - 5 %    Basophils % 0 0 - 2 %    Immature Granulocytes 0 0.0 - 0.5 %    Neutrophils Absolute 11.8 (H) 1.8 - 8.0 K/UL    Lymphocytes Absolute 0.8 (L) 0.9 - 3.6 K/UL    Monocytes Absolute 1.1 0.05 - 1.2 K/UL    Eosinophils Absolute 0.0 0.0 - 0.4 K/UL    Basophils Absolute 0.0 0.0 - 0.1 K/UL    Absolute Immature Granulocyte 0.0 0.00 - 0.04 K/UL    Differential Type AUTOMATED     Comprehensive Metabolic Panel    Collection Time: 10/31/21  5:47 PM   Result Value Ref Range    Sodium 143 136 - 145 mmol/L    Potassium 3.3 (L) 3.5 - 5.5 mmol/L    Chloride 116 (H) 100 - 111 mmol/L    CO2 24 21 - 32 mmol/L    Anion Gap 3 3.0 - 18 mmol/L    Glucose 125 (H) 74 - 99 mg/dL    BUN 14 7.0 - 18 MG/DL    Creatinine 1.91 (H) 0.6 - 1.3 MG/DL    Bun/Cre Ratio 7 (L) 12 - 20      Est, Glom Filt Rate 38 (L) >60 ml/min/1.74m    Calcium 9.4  8.5 - 10.1 MG/DL    Total Bilirubin 0.4 0.2 - 1.0 MG/DL    ALT 51 16 - 61 U/L    AST 116 (H) 10 - 38 U/L    Alk Phosphatase 81 45 - 117 U/L    Total Protein 7.7 6.4 - 8.2 g/dL    Albumin 3.6 3.4 - 5.0 g/dL    Globulin 4.1 (H) 2.0 - 4.0 g/dL    Albumin/Globulin Ratio 0.9 0.8 - 1.7     CK    Collection Time: 10/31/21  5:47 PM   Result Value Ref Range    Total CK 5,072 (H) 39 - 308 U/L   Urinalysis    Collection Time: 10/31/21  8:29 PM   Result Value Ref Range    Color, UA DARK YELLOW      Appearance CLOUDY      Specific Gravity, UA 1.016 1.005 - 1.030      pH, Urine 6.5 5.0 - 8.0      Protein, UA 300 (A) NEG mg/dL    Glucose, UA Negative NEG mg/dL    Ketones, Urine Negative NEG mg/dL    Bilirubin Urine Negative NEG      Blood, Urine LARGE (A) NEG      Urobilinogen, Urine 1.0 0.2 - 1.0 EU/dL    Nitrite, Urine Negative NEG      Leukocyte Esterase, Urine LARGE (A) NEG     Urinalysis, Micro    Collection Time: 10/31/21  8:29 PM   Result Value Ref Range    WBC, UA TOO NUMEROUS TO COUNT 0 - 4 /hpf    RBC, UA TOO  NUMEROUS TO COUNT 0 - 5 /hpf    Epithelial Cells UA 2+ 0 - 5 /lpf    BACTERIA, URINE 4+ (A) NEG /hpf    Trichomonas, Urine 0 to 2 NEG   Basic Metabolic Panel    Collection Time: 10/31/21  9:06 PM   Result Value Ref Range    Sodium 144 136 - 145 mmol/L    Potassium 3.6 3.5 - 5.5 mmol/L    Chloride 115 (H) 100 - 111 mmol/L    CO2 23 21 - 32 mmol/L    Anion Gap 6 3.0 - 18 mmol/L    Glucose 127 (H) 74 - 99 mg/dL    BUN 14 7.0 - 18 MG/DL    Creatinine 1.56 (H) 0.6 - 1.3 MG/DL    Bun/Cre Ratio 9 (L) 12 - 20      Est, Glom Filt Rate 48 (L) >60 ml/min/1.25m    Calcium 8.9 8.5 - 10.1 MG/DL   CK    Collection Time: 10/31/21  9:06 PM   Result Value Ref Range    Total CK 13,551 (H) 39 - 308 U/L       Imaging Reviewed:  No results found.           SJules Schick MD  10/31/2021, 11:27 PM        Disclaimer: Sections of this note are dictated using utilizing voice recognition software.  Minor typographical errors may be present.  If questions arise, please do not hesitate to contact me or call our department.

## 2021-10-31 NOTE — ED Triage Notes (Signed)
Pt arrived via EMS for reported heat exhaustion.  Per EMS the patient was found laying in the parking lot in layers of clothes.  Pt states he was walking and became so weak he could not walk anymore.  Per EMS the patient was in layers of clothes and appeared to be over heated.  Pt reports generalized weakness.

## 2021-10-31 NOTE — ED Provider Notes (Signed)
EMERGENCY DEPARTMENT HISTORY AND PHYSICAL EXAM    6:15 PM EDT seen at this time in hallway G        Date: 10/31/2021  Patient Name: Donald Haney    History of Presenting Illness     Chief Complaint   Patient presents with    Heat Exposure         History Provided By: Nursing triage note    Additional History (Context): SLATE Donald Haney is a 66 y.o. male presents with lying in the middle of a parking lot EMS notified had layers of close on feels warm to the touch his only complaint to me is he is thirsty and wants a drink of water.  He admits he is homeless.  Apparently told EMS he has been walking all day and got tired so he laid down.Marland Kitchen    PCP: No primary care provider on file.    Chief Complaint:   Duration:    Timing:    Location:   Quality:   Severity:   Modifying Factors:   Associated Symptoms:       Current Facility-Administered Medications   Medication Dose Route Frequency Provider Last Rate Last Admin    0.9 % sodium chloride infusion   IntraVENous Continuous Everlena Cooper, MD 125 mL/hr at 10/31/21 2148 New Bag at 10/31/21 2148    sodium bicarbonate 150 mEq in dextrose 5 % 1,000 mL infusion   IntraVENous Continuous Freddie Apley, MD        0.9 % sodium chloride bolus  1,000 mL IntraVENous Once Freddie Apley, MD         No current outpatient medications on file.       Past History     Past Medical History:  No past medical history on file.    Past Surgical History:  No past surgical history on file.    Family History:  No family history on file.    Social History:       Allergies:  No Known Allergies      Review of Systems     Review of Systems      Physical Exam       Patient Vitals for the past 12 hrs:   Temp Pulse Resp BP SpO2   10/31/21 2200 -- (!) 114 22 (!) 153/93 --   10/31/21 2145 -- (!) 105 (!) 9 -- --   10/31/21 2130 -- (!) 107 12 -- --   10/31/21 2115 -- (!) 107 10 -- 97 %   10/31/21 2100 -- (!) 108 13 -- --   10/31/21 2045 -- (!) 108 12 -- 95 %   10/31/21 2030 -- (!) 115 13 --  97 %   10/31/21 2015 -- (!) 109 13 -- 92 %   10/31/21 1907 -- (!) 101 12 -- 96 %   10/31/21 1902 99.6 F (37.6 C) -- -- -- --   10/31/21 1755 99 F (37.2 C) (!) 119 11 130/81 93 %       IPVITALS  Patient Vitals for the past 24 hrs:   BP Temp Temp src Pulse Resp SpO2 Height Weight   10/31/21 2200 (!) 153/93 -- -- (!) 114 22 -- -- --   10/31/21 2145 -- -- -- (!) 105 (!) 9 -- -- --   10/31/21 2130 -- -- -- (!) 107 12 -- -- --   10/31/21 2115 -- -- -- (!) 107 10 97 % -- --   10/31/21  2100 -- -- -- (!) 108 13 -- -- --   10/31/21 2045 -- -- -- (!) 108 12 95 % -- --   10/31/21 2030 -- -- -- (!) 115 13 97 % -- --   10/31/21 2015 -- -- -- (!) 109 13 92 % 5' 10"  (1.778 m) 162 lb (73.5 kg)   10/31/21 1907 -- -- -- (!) 101 12 96 % -- --   10/31/21 1902 -- 99.6 F (37.6 C) Rectal -- -- -- -- --   10/31/21 1755 130/81 99 F (37.2 C) Oral (!) 119 11 93 % -- --       Physical Exam  Constitutional:       Appearance: Normal appearance.   HENT:      Head: Normocephalic and atraumatic.   Cardiovascular:      Rate and Rhythm: Normal rate and regular rhythm.   Pulmonary:      Effort: Pulmonary effort is normal.      Breath sounds: Normal breath sounds.   Abdominal:      Tenderness: There is no abdominal tenderness.   Musculoskeletal:         General: Normal range of motion.      Cervical back: Normal range of motion and neck supple.   Skin:     General: Skin is warm and dry.   Neurological:      General: No focal deficit present.      Mental Status: He is alert.      Comments: Speech is normal         Diagnostic Study Results   Labs -  Recent Results (from the past 24 hour(s))   CBC with Auto Differential    Collection Time: 10/31/21  5:47 PM   Result Value Ref Range    WBC 13.7 (H) 4.6 - 13.2 K/uL    RBC 4.52 4.35 - 5.65 M/uL    Hemoglobin 13.3 13.0 - 16.0 g/dL    Hematocrit 39.2 36.0 - 48.0 %    MCV 86.7 78.0 - 100.0 FL    MCH 29.4 24.0 - 34.0 PG    MCHC 33.9 31.0 - 37.0 g/dL    RDW 13.2 11.6 - 14.5 %    Platelets 303 135 - 420  K/uL    MPV 10.0 9.2 - 11.8 FL    Nucleated RBCs 0.0 0 PER 100 WBC    nRBC 0.00 0.00 - 0.01 K/uL    Neutrophils % 86 (H) 40 - 73 %    Lymphocytes % 6 (L) 21 - 52 %    Monocytes % 8 3 - 10 %    Eosinophils % 0 0 - 5 %    Basophils % 0 0 - 2 %    Immature Granulocytes 0 0.0 - 0.5 %    Neutrophils Absolute 11.8 (H) 1.8 - 8.0 K/UL    Lymphocytes Absolute 0.8 (L) 0.9 - 3.6 K/UL    Monocytes Absolute 1.1 0.05 - 1.2 K/UL    Eosinophils Absolute 0.0 0.0 - 0.4 K/UL    Basophils Absolute 0.0 0.0 - 0.1 K/UL    Absolute Immature Granulocyte 0.0 0.00 - 0.04 K/UL    Differential Type AUTOMATED     Comprehensive Metabolic Panel    Collection Time: 10/31/21  5:47 PM   Result Value Ref Range    Sodium 143 136 - 145 mmol/L    Potassium 3.3 (L) 3.5 - 5.5 mmol/L    Chloride 116 (H) 100 - 111 mmol/L  CO2 24 21 - 32 mmol/L    Anion Gap 3 3.0 - 18 mmol/L    Glucose 125 (H) 74 - 99 mg/dL    BUN 14 7.0 - 18 MG/DL    Creatinine 1.91 (H) 0.6 - 1.3 MG/DL    Bun/Cre Ratio 7 (L) 12 - 20      Est, Glom Filt Rate 38 (L) >60 ml/min/1.49m    Calcium 9.4 8.5 - 10.1 MG/DL    Total Bilirubin 0.4 0.2 - 1.0 MG/DL    ALT 51 16 - 61 U/L    AST 116 (H) 10 - 38 U/L    Alk Phosphatase 81 45 - 117 U/L    Total Protein 7.7 6.4 - 8.2 g/dL    Albumin 3.6 3.4 - 5.0 g/dL    Globulin 4.1 (H) 2.0 - 4.0 g/dL    Albumin/Globulin Ratio 0.9 0.8 - 1.7     CK    Collection Time: 10/31/21  5:47 PM   Result Value Ref Range    Total CK 5,072 (H) 39 - 308 U/L   Urinalysis    Collection Time: 10/31/21  8:29 PM   Result Value Ref Range    Color, UA DARK YELLOW      Appearance CLOUDY      Specific Gravity, UA 1.016 1.005 - 1.030      pH, Urine 6.5 5.0 - 8.0      Protein, UA 300 (A) NEG mg/dL    Glucose, UA Negative NEG mg/dL    Ketones, Urine Negative NEG mg/dL    Bilirubin Urine Negative NEG      Blood, Urine LARGE (A) NEG      Urobilinogen, Urine 1.0 0.2 - 1.0 EU/dL    Nitrite, Urine Negative NEG      Leukocyte Esterase, Urine LARGE (A) NEG     Urinalysis, Micro     Collection Time: 10/31/21  8:29 PM   Result Value Ref Range    WBC, UA TOO NUMEROUS TO COUNT 0 - 4 /hpf    RBC, UA TOO NUMEROUS TO COUNT 0 - 5 /hpf    Epithelial Cells UA 2+ 0 - 5 /lpf    BACTERIA, URINE 4+ (A) NEG /hpf    Trichomonas, Urine 0 to 2 NEG   Basic Metabolic Panel    Collection Time: 10/31/21  9:06 PM   Result Value Ref Range    Sodium 144 136 - 145 mmol/L    Potassium 3.6 3.5 - 5.5 mmol/L    Chloride 115 (H) 100 - 111 mmol/L    CO2 23 21 - 32 mmol/L    Anion Gap 6 3.0 - 18 mmol/L    Glucose 127 (H) 74 - 99 mg/dL    BUN 14 7.0 - 18 MG/DL    Creatinine 1.56 (H) 0.6 - 1.3 MG/DL    Bun/Cre Ratio 9 (L) 12 - 20      Est, Glom Filt Rate 48 (L) >60 ml/min/1.772m   Calcium 8.9 8.5 - 10.1 MG/DL   CK    Collection Time: 10/31/21  9:06 PM   Result Value Ref Range    Total CK 13,551 (H) 39 - 308 U/L       Radiologic Studies -   No orders to display     @[id number] @    Medications ordered:   Medications   0.9 % sodium chloride infusion ( IntraVENous New Bag 10/31/21 2148)   sodium bicarbonate 150 mEq in dextrose 5 % 1,000  mL infusion (has no administration in time range)   0.9 % sodium chloride bolus (has no administration in time range)   0.9 % sodium chloride bolus (0 mLs IntraVENous Stopped 10/31/21 2100)   ondansetron (ZOFRAN) injection 4 mg (4 mg IntraVENous Given 10/31/21 2207)         Medical Decision Making   Initial Medical Decision Making and DDx:  Heat exhaustion, rhabdo, no evidence for heatstroke on initial assessment.  He is getting a liter of fluids.  Pulse remains 119 and is warm to the touch.  We will get rectal temperature.    ED Course: Progress Notes, Reevaluation, and Consults:  ED Course as of 10/31/21 2310   Sun Oct 31, 2021   2305 Vital sign abnormalities due to dehydration and hyperthermia from exposure.  The patient is not septic [CB]      ED Course User Index  [CB] Freddie Apley, MD     11:10 PM EDT  Climbing CK, improving creatinine for the moment.  K has improved.  Discussed with Dr.  Josie Saunders hospitalist agrees with admission, bicarb drip started, fluid resuscitation.  Admitted to telemetry.    30 minutes critical care time rhabdomyolysis risk for kidney failure requiring bicarb infusion and fluid resuscitation.    I am the first provider for this patient.    I reviewed the vital signs, available nursing notes, past medical history, past surgical history, family history and social history.    Patient Vitals for the past 12 hrs:   Temp Pulse Resp BP SpO2   10/31/21 2200 -- (!) 114 22 (!) 153/93 --   10/31/21 2145 -- (!) 105 (!) 9 -- --   10/31/21 2130 -- (!) 107 12 -- --   10/31/21 2115 -- (!) 107 10 -- 97 %   10/31/21 2100 -- (!) 108 13 -- --   10/31/21 2045 -- (!) 108 12 -- 95 %   10/31/21 2030 -- (!) 115 13 -- 97 %   10/31/21 2015 -- (!) 109 13 -- 92 %   10/31/21 1907 -- (!) 101 12 -- 96 %   10/31/21 1902 99.6 F (37.6 C) -- -- -- --   10/31/21 1755 99 F (37.2 C) (!) 119 11 130/81 93 %       Vital Signs-Reviewed the patient's vital signs.    Pulse Oximetry Analysis, Cardiac Monitor, 12 lead ekg:      Interpreted by the EP.      Records Reviewed: Nursing notes reviewed (Time of Review: 11:10 PM)    Procedures:   Critical Care Time: 0  If critical care time is note it is exclusive of any separately billable procedures.     Aspirin: (was aspirin given for stroke?)    Diagnosis     Clinical Impression:   1. Heat exposure, initial encounter    2. Non-traumatic rhabdomyolysis        Disposition: DISPOSITION Admitted 10/31/2021 11:10:23 PM       New for 2023:  DISCHARGE MEDICATIONS:  There are no discharge medications for this patient.      DISCONTINUED MEDICATIONS:  There are no discharge medications for this patient.      PATIENT REFERRED TO:  Follow Up with:  No follow-up provider specified.  _______________________________    Notes:    Doristine Counter, MD using Dragon dictation      _______________________________     Freddie Apley, MD  10/31/21 [phone]

## 2021-10-31 NOTE — ED Notes (Signed)
Verbal shift change report given to Josh (Cabin crew) by Suan Halter (offgoing nurse). Report included the following information Nurse Handoff Report, ED SBAR, and MAR.       Ritta Slot, RN  10/31/21 1930

## 2021-10-31 NOTE — ED Notes (Signed)
Patient given urinal     Butch Penny, RN  10/31/21 2024

## 2021-10-31 NOTE — ED Notes (Signed)
Pt found in room attempting to get up to use urinal. Bed is soaked in urine because patient quote "could not get up in time." Linen changed and condom cath applied per verbal order from admitting MD.      Annia Belt, RN  10/31/21 2324

## 2021-11-01 LAB — EKG 12-LEAD
Atrial Rate: 118 {beats}/min
P Axis: 72 degrees
P-R Interval: 168 ms
Q-T Interval: 326 ms
QRS Duration: 72 ms
QTc Calculation (Bazett): 456 ms
R Axis: 52 degrees
T Axis: 71 degrees
Ventricular Rate: 118 {beats}/min

## 2021-11-01 LAB — URINALYSIS
Bilirubin Urine: NEGATIVE
Glucose, UA: NEGATIVE mg/dL
Ketones, Urine: NEGATIVE mg/dL
Nitrite, Urine: NEGATIVE
Protein, UA: 300 mg/dL — AB
Specific Gravity, UA: 1.016 (ref 1.005–1.030)
Urobilinogen, Urine: 1 EU/dL (ref 0.2–1.0)
pH, Urine: 6.5 (ref 5.0–8.0)

## 2021-11-01 LAB — BASIC METABOLIC PANEL W/ REFLEX TO MG FOR LOW K
Anion Gap: 3 mmol/L (ref 3.0–18)
BUN: 12 MG/DL (ref 7.0–18)
Bun/Cre Ratio: 11 — ABNORMAL LOW (ref 12–20)
CO2: 25 mmol/L (ref 21–32)
Calcium: 8 MG/DL — ABNORMAL LOW (ref 8.5–10.1)
Chloride: 113 mmol/L — ABNORMAL HIGH (ref 100–111)
Creatinine: 1.1 MG/DL (ref 0.6–1.3)
Est, Glom Filt Rate: >60 mL/min/{1.73_m2} (ref >60)
Glucose: 93 mg/dL (ref 74–99)
Potassium: 3.9 mmol/L (ref 3.5–5.5)
Sodium: 141 mmol/L (ref 136–145)

## 2021-11-01 LAB — BASIC METABOLIC PANEL
Anion Gap: 6 mmol/L (ref 3.0–18)
BUN/Creatinine Ratio: 9 — ABNORMAL LOW (ref 12–20)
BUN: 14 MG/DL (ref 7.0–18)
CO2: 23 mmol/L (ref 21–32)
Calcium: 8.9 MG/DL (ref 8.5–10.1)
Chloride: 115 mmol/L — ABNORMAL HIGH (ref 100–111)
Creatinine: 1.56 MG/DL — ABNORMAL HIGH (ref 0.6–1.3)
Est, Glom Filt Rate: 48 mL/min/{1.73_m2} — ABNORMAL LOW (ref >60)
Glucose: 127 mg/dL — ABNORMAL HIGH (ref 74–99)
Potassium: 3.6 mmol/L (ref 3.5–5.5)
Sodium: 144 mmol/L (ref 136–145)

## 2021-11-01 LAB — CK
Total CK: 13551 U/L — ABNORMAL HIGH (ref 39–308)
Total CK: >50000 U/L — ABNORMAL HIGH (ref 39–308)

## 2021-11-01 LAB — CBC WITH AUTO DIFFERENTIAL
Absolute Immature Granulocyte: 0.1 10*3/uL — ABNORMAL HIGH (ref 0.00–0.04)
Basophils %: 0 % (ref 0–2)
Basophils Absolute: 0.1 10*3/uL (ref 0.0–0.1)
Eosinophils %: 0 % (ref 0–5)
Eosinophils Absolute: 0 10*3/uL (ref 0.0–0.4)
Hematocrit: 39 % (ref 36.0–48.0)
Hemoglobin: 12.9 g/dL — ABNORMAL LOW (ref 13.0–16.0)
Immature Granulocytes: 1 % — ABNORMAL HIGH (ref 0.0–0.5)
Lymphocytes %: 13 % — ABNORMAL LOW (ref 21–52)
Lymphocytes Absolute: 2.3 10*3/uL (ref 0.9–3.6)
MCH: 29.1 PG (ref 24.0–34.0)
MCHC: 33.1 g/dL (ref 31.0–37.0)
MCV: 88 FL (ref 78.0–100.0)
MPV: 10.3 FL (ref 9.2–11.8)
Monocytes %: 6 % (ref 3–10)
Monocytes Absolute: 1 10*3/uL (ref 0.05–1.2)
Neutrophils %: 81 % — ABNORMAL HIGH (ref 40–73)
Neutrophils Absolute: 14.5 10*3/uL — ABNORMAL HIGH (ref 1.8–8.0)
Nucleated RBCs: 0 PER 100 WBC
Platelets: 253 10*3/uL (ref 135–420)
RBC: 4.43 M/uL (ref 4.35–5.65)
RDW: 13.3 % (ref 11.6–14.5)
WBC: 18 10*3/uL — ABNORMAL HIGH (ref 4.6–13.2)
nRBC: 0 10*3/uL (ref 0.00–0.01)

## 2021-11-01 LAB — HEMOGLOBIN A1C
Hemoglobin A1C: 12.8 % — ABNORMAL HIGH (ref 4.2–5.6)
eAG: 321 mg/dL

## 2021-11-01 LAB — POCT GLUCOSE
POC Glucose: 114 mg/dL — ABNORMAL HIGH (ref 70–110)
POC Glucose: 162 mg/dL — ABNORMAL HIGH (ref 70–110)
POC Glucose: 108 mg/dL (ref 70–110)
POC Glucose: 108 mg/dL (ref 70–110)

## 2021-11-01 LAB — URINALYSIS, MICRO: Trichomonas, Urine: 0

## 2021-11-01 MED ORDER — DEXTROSE 5 % IV SOLN
5 % | INTRAVENOUS | Status: AC
Start: 2021-11-01 — End: 2021-11-02
  Administered 2021-11-01: 06:00:00 via INTRAVENOUS

## 2021-11-01 MED ORDER — SODIUM CHLORIDE 0.9 % IV BOLUS
0.9 % | Freq: Once | INTRAVENOUS | Status: AC
Start: 2021-11-01 — End: 2021-11-01
  Administered 2021-11-01: 03:00:00 1000 mL via INTRAVENOUS

## 2021-11-01 MED ORDER — DEXTROSE 10 % IV SOLN
10 % | INTRAVENOUS | Status: AC | PRN
Start: 2021-11-01 — End: 2021-11-06

## 2021-11-01 MED ORDER — DEXTROSE 10 % IV BOLUS
INTRAVENOUS | Status: AC | PRN
Start: 2021-11-01 — End: 2021-11-06

## 2021-11-01 MED ORDER — ACETAMINOPHEN 325 MG PO TABS
325 | Freq: Four times a day (QID) | ORAL | Status: DC | PRN
Start: 2021-11-01 — End: 2021-11-06
  Administered 2021-11-02 – 2021-11-05 (×2): 650 mg via ORAL

## 2021-11-01 MED ORDER — ONDANSETRON HCL 4 MG/2ML IJ SOLN
4 MG/2ML | INTRAMUSCULAR | Status: AC
Start: 2021-11-01 — End: 2021-10-31
  Administered 2021-11-01: 02:00:00 4 mg via INTRAVENOUS

## 2021-11-01 MED ORDER — CEFTRIAXONE SODIUM 1 G IJ SOLR
1 g | INTRAMUSCULAR | Status: AC
Start: 2021-11-01 — End: 2021-11-05
  Administered 2021-11-01 – 2021-11-05 (×5): 1000 mg via INTRAVENOUS

## 2021-11-01 MED ORDER — ACETAMINOPHEN 650 MG RE SUPP
650 | Freq: Four times a day (QID) | RECTAL | Status: DC | PRN
Start: 2021-11-01 — End: 2021-11-06

## 2021-11-01 MED ORDER — INSULIN LISPRO 100 UNIT/ML IJ SOLN
100 UNIT/ML | Freq: Three times a day (TID) | INTRAMUSCULAR | Status: AC
Start: 2021-11-01 — End: 2021-11-01

## 2021-11-01 MED ORDER — GLUCOSE 4 G PO CHEW
4 g | ORAL | Status: AC | PRN
Start: 2021-11-01 — End: 2021-11-06

## 2021-11-01 MED ORDER — GLUCAGON (RDNA) 1 MG IJ KIT
1 MG | INTRAMUSCULAR | Status: AC | PRN
Start: 2021-11-01 — End: 2021-11-06

## 2021-11-01 MED ORDER — ONDANSETRON HCL 4 MG/2ML IJ SOLN
4 MG/2ML | Freq: Four times a day (QID) | INTRAMUSCULAR | Status: AC | PRN
Start: 2021-11-01 — End: 2021-11-06

## 2021-11-01 MED ORDER — ONDANSETRON 4 MG PO TBDP
4 MG | Freq: Three times a day (TID) | ORAL | Status: AC | PRN
Start: 2021-11-01 — End: 2021-11-06

## 2021-11-01 MED ORDER — POLYETHYLENE GLYCOL 3350 17 G PO PACK
17 g | Freq: Every day | ORAL | Status: AC | PRN
Start: 2021-11-01 — End: 2021-11-06

## 2021-11-01 MED ORDER — INSULIN LISPRO 100 UNIT/ML IJ SOLN
100 UNIT/ML | Freq: Every evening | INTRAMUSCULAR | Status: AC
Start: 2021-11-01 — End: 2021-11-01

## 2021-11-01 MED ORDER — ENOXAPARIN SODIUM 40 MG/0.4ML IJ SOSY
40 MG/0.4ML | Freq: Every day | INTRAMUSCULAR | Status: AC
Start: 2021-11-01 — End: 2021-11-06
  Administered 2021-11-01 – 2021-11-05 (×5): 40 mg via SUBCUTANEOUS

## 2021-11-01 MED FILL — SODIUM BICARBONATE 8.4 % IV SOLN: 8.4 % | INTRAVENOUS | Qty: 150

## 2021-11-01 MED FILL — ONDANSETRON HCL 4 MG/2ML IJ SOLN: 4 MG/2ML | INTRAMUSCULAR | Qty: 2

## 2021-11-01 MED FILL — LOVENOX 40 MG/0.4ML IJ SOSY: 40 MG/0.4ML | INTRAMUSCULAR | Qty: 0.4

## 2021-11-01 MED FILL — SODIUM CHLORIDE 0.9 % IV SOLN: 0.9 % | INTRAVENOUS | Qty: 1000

## 2021-11-01 MED FILL — CEFTRIAXONE SODIUM 1 G IJ SOLR: 1 g | INTRAMUSCULAR | Qty: 1000

## 2021-11-01 NOTE — Progress Notes (Signed)
[facility]  Progress Note    Patient: Donald Haney Age: 66 y.o. DOB: [birth year] MR#: [id number] SSN: [id number]  Date/Time: 11/01/2021     Subjective:   Patient doing well, pleasant this AM. Eating and drinking well, no pain complaints other than his abrasions.    Review of systems  General: No fevers or chills.  Cardiovascular: No chest pain or pressure. No palpitations.   Pulmonary: No shortness of breath, cough or wheeze.   Gastrointestinal: No abdominal pain, nausea, vomiting or diarrhea.   Genitourinary: No urinary frequency, urgency, hesitancy or dysuria.   Musculoskeletal: No joint or muscle pain, no back pain, no recent trauma.    Neurologic: No headache, numbness, tingling or weakness.   Assessment/Plan:   Rhabdomyolysis-continue IV fluids, started on bicarbonate gtt. in the ER, will follow CK levels.check uric acid  AKI-likely prerenal, continue IV fluids, continue to monitor.likely consult nephrology in the AM.  UTI-IV Rocephin, redraw urine cx.  Homeless-Case management consultation for placement.  Bruising and abrasion on bilateral knees-wound care consultation in AM.  History of type 2 diabetes - A1c is at 12.8, insulin sliding scale, change to moderate SSI.  DVT prophylaxis-Lovenox  Full code      I spent 40 minutes with the patient in face-to-face consultation, of which greater than 50% was spent in counseling and coordination of care as described above.    Case discussed with:  [x] Patient  [] Family  [] Nursing  [] Case Management  DVT Prophylaxis:  [x] Lovenox  [] Hep SQ  [] SCDs  [] Coumadin   [] Eliquis/Xarelto     Objective:   VS: BP 111/66   Pulse 97   Temp 99.3 F (37.4 C) (Oral)   Resp 18   Ht 5\' 10"  (1.778 m)   Wt 162 lb (73.5 kg)   SpO2 97%   PF (!) 18 L/min   BMI 23.24 kg/m    Tmax/24hrs: Temp (24hrs), Avg:98.6 F (37 C), Min:97.9 F (36.6 C), Max:99.3 F (37.4 C)  IOBRIEF  Intake/Output Summary (Last 24 hours) at 11/01/2021 2117  Last data  filed at 11/01/2021 1730  Gross per 24 hour   Intake --   Output 1550 ml   Net -1550 ml       General:  Alert, cooperative, no acute distress    HEENT: PERRLA, anicteric sclerae.Abrasion to face as well  Pulmonary:  CTA Bilaterally. No Wheezing/Rales.  Cardiovascular: Regular rate and Rhythm.  GI:  Soft, Non distended, Non tender. + Bowel sounds.  Extremities:  No edema. No calf tenderness. Patient has abrasions on both knees  Psych: Good insight. Not anxious or agitated.  Neurologic: Alert and oriented X 3. Moves all ext.  Additional:    Medications:   Current Facility-Administered Medications   Medication Dose Route Frequency    0.9 % sodium chloride infusion   IntraVENous Continuous    sodium bicarbonate 150 mEq in dextrose 5 % 1,000 mL infusion   IntraVENous Continuous    enoxaparin (LOVENOX) injection 40 mg  40 mg SubCUTAneous Daily    ondansetron (ZOFRAN-ODT) disintegrating tablet 4 mg  4 mg Oral Q8H PRN    Or    ondansetron (ZOFRAN) injection 4 mg  4 mg IntraVENous Q6H PRN    polyethylene glycol (GLYCOLAX) packet 17 g  17 g Oral Daily PRN    acetaminophen (TYLENOL) tablet 650 mg  650 mg Oral Q6H PRN    Or    acetaminophen (TYLENOL) suppository 650 mg  650 mg Rectal  Q6H PRN    cefTRIAXone (ROCEPHIN) 1,000 mg in sterile water 10 mL IV syringe  1,000 mg IntraVENous Q24H    glucose chewable tablet 16 g  4 tablet Oral PRN    dextrose bolus 10% 125 mL  125 mL IntraVENous PRN    Or    dextrose bolus 10% 250 mL  250 mL IntraVENous PRN    glucagon injection 1 mg  1 mg SubCUTAneous PRN    dextrose 10 % infusion   IntraVENous Continuous PRN    insulin lispro (HUMALOG) injection vial 0-4 Units  0-4 Units SubCUTAneous TID WC    insulin lispro (HUMALOG) injection vial 0-4 Units  0-4 Units SubCUTAneous Nightly       Labs:    Recent Results (from the past 24 hour(s))   POCT Glucose    Collection Time: 11/01/21 12:18 AM   Result Value Ref Range    POC Glucose 114 (H) 70 - 110 mg/dL   CBC with Auto Differential    Collection  Time: 11/01/21  5:15 AM   Result Value Ref Range    WBC 18.0 (H) 4.6 - 13.2 K/uL    RBC 4.43 4.35 - 5.65 M/uL    Hemoglobin 12.9 (L) 13.0 - 16.0 g/dL    Hematocrit 08.6 57.8 - 48.0 %    MCV 88.0 78.0 - 100.0 FL    MCH 29.1 24.0 - 34.0 PG    MCHC 33.1 31.0 - 37.0 g/dL    RDW 46.9 62.9 - 52.8 %    Platelets 253 135 - 420 K/uL    MPV 10.3 9.2 - 11.8 FL    Nucleated RBCs 0.0 0 PER 100 WBC    nRBC 0.00 0.00 - 0.01 K/uL    Neutrophils % 81 (H) 40 - 73 %    Lymphocytes % 13 (L) 21 - 52 %    Monocytes % 6 3 - 10 %    Eosinophils % 0 0 - 5 %    Basophils % 0 0 - 2 %    Immature Granulocytes 1 (H) 0.0 - 0.5 %    Neutrophils Absolute 14.5 (H) 1.8 - 8.0 K/UL    Lymphocytes Absolute 2.3 0.9 - 3.6 K/UL    Monocytes Absolute 1.0 0.05 - 1.2 K/UL    Eosinophils Absolute 0.0 0.0 - 0.4 K/UL    Basophils Absolute 0.1 0.0 - 0.1 K/UL    Absolute Immature Granulocyte 0.1 (H) 0.00 - 0.04 K/UL    Differential Type AUTOMATED     Basic Metabolic Panel w/ Reflex to MG    Collection Time: 11/01/21  6:40 AM   Result Value Ref Range    Sodium 141 136 - 145 mmol/L    Potassium 3.9 3.5 - 5.5 mmol/L    Chloride 113 (H) 100 - 111 mmol/L    CO2 25 21 - 32 mmol/L    Anion Gap 3 3.0 - 18 mmol/L    Glucose 93 74 - 99 mg/dL    BUN 12 7.0 - 18 MG/DL    Creatinine 4.13 0.6 - 1.3 MG/DL    Bun/Cre Ratio 11 (L) 12 - 20      Est, Glom Filt Rate >60 >60 ml/min/1.49m2    Calcium 8.0 (L) 8.5 - 10.1 MG/DL   CK    Collection Time: 11/01/21  6:40 AM   Result Value Ref Range    Total CK >50,000 (H) 39 - 308 U/L   POCT Glucose    Collection Time:  11/01/21 10:37 AM   Result Value Ref Range    POC Glucose 162 (H) 70 - 110 mg/dL   POCT Glucose    Collection Time: 11/01/21  1:55 PM   Result Value Ref Range    POC Glucose 108 70 - 110 mg/dL   POCT Glucose    Collection Time: 11/01/21  5:30 PM   Result Value Ref Range    POC Glucose 108 70 - 110 mg/dL   POCT Glucose    Collection Time: 11/01/21  8:51 PM   Result Value Ref Range    POC Glucose 256 (H) 70 - 110 mg/dL        Signed By: Ferdie Ping, DO     November 01, 2021      Disclaimer: Sections of this note are dictated using utilizing voice recognition software.  Minor typographical errors may be present. If questions arise, please do not hesitate to contact me or call our department.

## 2021-11-01 NOTE — ED Notes (Signed)
Pt attempted to get out of bed to urinate. This RN reminded the patient that he has a condom cath on and can pee into that.      Annia Belt, RN  11/01/21 0022

## 2021-11-01 NOTE — ED Notes (Signed)
Assumed care of patient from [place], RN and Ivin Booty, RN at bedside.  Patient is A&O and resting with eyes closed in position of comfort. Patient has call bell in reach.     Roxanna Mew, RN  11/01/21 [phone]

## 2021-11-01 NOTE — ED Notes (Signed)
Attempted to call report, but no answer on 5 [place].  Notified my charge nurse.     Roxanna Mew, RN  11/01/21 1546

## 2021-11-01 NOTE — Consults (Signed)
Room #: ER06      HAR: [id number]      Situation: Wound Care Consult    Background:    PMH:   Active Ambulatory Problems     Diagnosis Date Noted    No Active Ambulatory Problems     Resolved Ambulatory Problems     Diagnosis Date Noted    No Resolved Ambulatory Problems     No Additional Past Medical History        Braden Score: UNK/23   BMI: Body mass index is 23.24 kg/m.   Preventive measures in place: limited layers    Assessment:   Patient found reclined.  Patient is Awake and alert, Oriented x person, place, time and situation, and Pleasant and conversant. Reports falling down in the street and this is the source of his wounds. Also reports hitting his face.     Wound(s) Description:           Wound 11/01/21 Knee Right;Dorsal (Active)   Wound Image   11/01/21 1231   Wound Etiology Traumatic 11/01/21 1231   Dressing Status New dressing applied 11/01/21 1231   Wound Cleansed Irrigated with saline 11/01/21 1231   Dressing/Treatment Silicone border 11/01/21 1231   Wound Length (cm) 2 cm 11/01/21 1231   Wound Width (cm) 3 cm 11/01/21 1231   Wound Surface Area (cm^2) 6 cm^2 11/01/21 1231   Wound Assessment Granulation tissue 11/01/21 1231   Drainage Amount Scant 11/01/21 1231   Drainage Description Serosanguinous 11/01/21 1231   Odor None 11/01/21 1231   Peri-wound Assessment Hypopigmented 11/01/21 1231   Number of days: 0       Wound 11/01/21 Knee Left;Dorsal (Active)   Wound Image   11/01/21 1232   Wound Etiology Traumatic 11/01/21 1232   Dressing Status New dressing applied 11/01/21 1232   Wound Cleansed Irrigated with saline 11/01/21 1232   Dressing/Treatment Silicone border 11/01/21 1232   Wound Length (cm) 2 cm 11/01/21 1232   Wound Width (cm) 2 cm 11/01/21 1232   Wound Surface Area (cm^2) 4 cm^2 11/01/21 1232   Wound Assessment Granulation tissue 11/01/21 1232   Drainage Amount None 11/01/21 1232   Odor None 11/01/21 1232   Peri-wound Assessment Hypopigmented 11/01/21 1232   Number of days: 0       Wound  11/01/21 Elbow Left;Posterior (Active)   Wound Image   11/01/21 1233   Wound Etiology Traumatic 11/01/21 1233   Dressing/Treatment Silicone border 11/01/21 1233   Wound Length (cm) 2 cm 11/01/21 1233   Wound Width (cm) 2 cm 11/01/21 1233   Wound Surface Area (cm^2) 4 cm^2 11/01/21 1233   Wound Assessment Granulation tissue 11/01/21 1233   Drainage Amount Scant 11/01/21 1233   Odor None 11/01/21 1233   Peri-wound Assessment Edematous;Hypopigmented;Hyperpigmented 11/01/21 1233   Number of days: 0       Wound 11/01/21 Elbow Posterior;Right (Active)   Wound Image   11/01/21 1234   Wound Etiology Traumatic 11/01/21 1234   Wound Cleansed Irrigated with saline 11/01/21 1234   Dressing/Treatment Silicone border 11/01/21 1234   Wound Length (cm) 1 cm 11/01/21 1234   Wound Width (cm) 1 cm 11/01/21 1234   Wound Surface Area (cm^2) 1 cm^2 11/01/21 1234   Wound Assessment Granulation tissue 11/01/21 1234   Drainage Amount Scant 11/01/21 1234   Odor None 11/01/21 1234   Peri-wound Assessment Hypopigmented;Hyperpigmented;Edematous;Blanchable erythema 11/01/21 1234   Number of days: 0       Wound 11/01/21  Face Right (Active)   Wound Image   11/01/21 1235   Wound Etiology Traumatic 11/01/21 1235   Wound Length (cm) 1 cm 11/01/21 1235   Wound Width (cm) 0.2 cm 11/01/21 1235   Wound Surface Area (cm^2) 0.2 cm^2 11/01/21 1235   Wound Assessment Purple/maroon 11/01/21 1235   Drainage Amount None 11/01/21 1235   Odor None 11/01/21 1235   Number of days: 0                Recommendations:    Wound care orders as follows: Clean wounds with saline or wound spray then apply Silicone dressing to bilateral knees and elbows.  Change every 3 days and prn soilage.       Supplies Used: silicone, saline       Care turned over to nursing staff at this time.    Oleh Genin. Cobbs BSN, RN, [facility], [facility]  [facility] Wound Care Dept  [phone]

## 2021-11-01 NOTE — Progress Notes (Signed)
Chaplain completed the initial Spiritual Assessment of the patient, and offered Pastoral Care support to the patient in room 6 of the emergency room where he will be admitted to the hospital. There is no advance directive present. Patient does not have any religious/cultural needs that will affect patient's preferences in health care. Chaplains will continue to follow and will provide pastoral care on an as needed/requested basis.    Dewaine Oats  Spiritual Care Department  [phone]

## 2021-11-01 NOTE — ACP (Advance Care Planning) (Signed)
Advance Care Planning     Advance Care Planning Inpatient Note  Spiritual Care Department    Today's Date: 11/01/2021  Unit: [facility] EMERGENCY DEPT    Received request from .  Upon review of chart and communication with care team, patient's decision making abilities are not in question.. Patient was/were present in the room during visit.    Goals of ACP Conversation:  Discuss advance care planning documents    Health Care Decision Makers:     No healthcare decision makers have been documented.  Click here to complete Clinical research associate of the Environmental health practitioner Relationship (ie "Primary")  Summary:  No Decision Maker named by patient at this time    Advance Care Planning Documents (Patient Wishes):  None     Assessment:  Visited patient in the emergency room bed 6 where he will be admitted to the hospital from with Rhabdomoyolsis  There is no advance directive present . Patient was responsive.    Interventions:  Patient DECLINED ACP conversation    Care Preferences Communicated:   No    Outcomes/Plan:      Electronically signed by Charmaine Downs., [facility] on 11/01/2021 at 12:10 PM

## 2021-11-01 NOTE — ED Notes (Signed)
Pt has been asleep in bed since 1am. Pt is alert and responsive. Fluids still running.     Annia Belt, RN  11/01/21 [phone]

## 2021-11-01 NOTE — Progress Notes (Signed)
Pt arrived to unit. A&Ox4, RA. Skin assessed, wounds noted as documented. Mepilex dressings applied to wounds. Educated Pt on use of call bell. Bed alarm on. NS infusing at 174mL/hr.

## 2021-11-01 NOTE — ED Notes (Signed)
Report called to [address] [place] and given to [place], Charity fundraiser.     Roxanna Mew, RN  11/01/21 1605

## 2021-11-02 LAB — CBC WITH AUTO DIFFERENTIAL
Absolute Immature Granulocyte: 0.1 10*3/uL — ABNORMAL HIGH (ref 0.00–0.04)
Basophils %: 0 % (ref 0–2)
Basophils Absolute: 0.1 10*3/uL (ref 0.0–0.1)
Eosinophils %: 2 % (ref 0–5)
Eosinophils Absolute: 0.2 10*3/uL (ref 0.0–0.4)
Hematocrit: 36.5 % (ref 36.0–48.0)
Hemoglobin: 12.1 g/dL — ABNORMAL LOW (ref 13.0–16.0)
Immature Granulocytes: 0 % (ref 0.0–0.5)
Lymphocytes %: 22 % (ref 21–52)
Lymphocytes Absolute: 3 10*3/uL (ref 0.9–3.6)
MCH: 29.3 PG (ref 24.0–34.0)
MCHC: 33.2 g/dL (ref 31.0–37.0)
MCV: 88.4 FL (ref 78.0–100.0)
MPV: 10.6 FL (ref 9.2–11.8)
Monocytes %: 7 % (ref 3–10)
Monocytes Absolute: 0.9 10*3/uL (ref 0.05–1.2)
Neutrophils %: 69 % (ref 40–73)
Neutrophils Absolute: 9.6 10*3/uL — ABNORMAL HIGH (ref 1.8–8.0)
Nucleated RBCs: 0 PER 100 WBC
Platelets: 246 10*3/uL (ref 135–420)
RBC: 4.13 M/uL — ABNORMAL LOW (ref 4.35–5.65)
RDW: 13.1 % (ref 11.6–14.5)
WBC: 13.9 10*3/uL — ABNORMAL HIGH (ref 4.6–13.2)
nRBC: 0 10*3/uL (ref 0.00–0.01)

## 2021-11-02 LAB — URINE DRUG SCREEN
Amphetamine, Urine: NEGATIVE
Barbiturates, Urine: NEGATIVE
Benzodiazepines, Urine: NEGATIVE
Cocaine, Urine: NEGATIVE
Methadone, Urine: NEGATIVE
Opiates, Urine: NEGATIVE
PCP, Urine: NEGATIVE
THC, TH-Cannabinol, Urine: NEGATIVE

## 2021-11-02 LAB — BASIC METABOLIC PANEL W/ REFLEX TO MG FOR LOW K
Anion Gap: 4 mmol/L (ref 3.0–18)
BUN: 11 MG/DL (ref 7.0–18)
Bun/Cre Ratio: 12 (ref 12–20)
CO2: 25 mmol/L (ref 21–32)
Calcium: 8.2 MG/DL — ABNORMAL LOW (ref 8.5–10.1)
Chloride: 110 mmol/L (ref 100–111)
Creatinine: 0.91 MG/DL (ref 0.6–1.3)
Est, Glom Filt Rate: >60 mL/min/{1.73_m2} (ref >60)
Glucose: 103 mg/dL — ABNORMAL HIGH (ref 74–99)
Potassium: 3.4 mmol/L — ABNORMAL LOW (ref 3.5–5.5)
Sodium: 139 mmol/L (ref 136–145)

## 2021-11-02 LAB — CK
Total CK: 30567 U/L — ABNORMAL HIGH (ref 39–308)
Total CK: 14192 U/L — ABNORMAL HIGH (ref 39–308)

## 2021-11-02 LAB — POCT GLUCOSE
POC Glucose: 256 mg/dL — ABNORMAL HIGH (ref 70–110)
POC Glucose: 194 mg/dL — ABNORMAL HIGH (ref 70–110)
POC Glucose: 124 mg/dL — ABNORMAL HIGH (ref 70–110)
POC Glucose: 116 mg/dL — ABNORMAL HIGH (ref 70–110)

## 2021-11-02 LAB — URIC ACID: Uric Acid: 8.2 MG/DL — ABNORMAL HIGH (ref 2.6–7.2)

## 2021-11-02 LAB — MAGNESIUM: Magnesium: 1.8 mg/dL (ref 1.6–2.6)

## 2021-11-02 MED ORDER — INSULIN LISPRO 100 UNIT/ML IJ SOLN
100 UNIT/ML | Freq: Every evening | INTRAMUSCULAR | Status: AC
Start: 2021-11-02 — End: 2021-11-06

## 2021-11-02 MED ORDER — POTASSIUM BICARBONATE 25 MEQ PO TBEF
25 MEQ | Freq: Once | ORAL | Status: AC
Start: 2021-11-02 — End: 2021-11-02
  Administered 2021-11-02: 16:00:00 25 meq via ORAL

## 2021-11-02 MED ORDER — LACTATED RINGERS IV SOLN
INTRAVENOUS | Status: AC
Start: 2021-11-02 — End: 2021-11-06
  Administered 2021-11-02 – 2021-11-03 (×3): via INTRAVENOUS

## 2021-11-02 MED ORDER — INSULIN LISPRO 100 UNIT/ML IJ SOLN
100 UNIT/ML | Freq: Three times a day (TID) | INTRAMUSCULAR | Status: AC
Start: 2021-11-02 — End: 2021-11-06

## 2021-11-02 MED FILL — HUMALOG 100 UNIT/ML IJ SOLN: 100 UNIT/ML | INTRAMUSCULAR | Qty: 1

## 2021-11-02 MED FILL — LOVENOX 40 MG/0.4ML IJ SOSY: 40 MG/0.4ML | INTRAMUSCULAR | Qty: 0.4

## 2021-11-02 MED FILL — ACETAMINOPHEN 325 MG PO TABS: 325 MG | ORAL | Qty: 2

## 2021-11-02 MED FILL — SODIUM BICARBONATE 8.4 % IV SOLN: 8.4 % | INTRAVENOUS | Qty: 150

## 2021-11-02 MED FILL — CEFTRIAXONE SODIUM 1 G IJ SOLR: 1 g | INTRAMUSCULAR | Qty: 1000

## 2021-11-02 MED FILL — EFFER-K 25 MEQ PO TBEF: 25 MEQ | ORAL | Qty: 1

## 2021-11-02 MED FILL — LACTATED RINGERS IV SOLN: INTRAVENOUS | Qty: 1000

## 2021-11-02 NOTE — Progress Notes (Signed)
Consult Note      Consult requested by: Ferdie Ping, DO    ADMIT DATE: 10/31/2021  CONSULT DATE: November 03, 2021                 Admission diagnosis: Rhabdomyolysis   Reason for Nephrology Consultation: AKI    Assessment and plan  #1 acute kidney injury likely prerenal, improved with IV fluids  #2 rhabdomyolysis secondary to drug abuse unclear which drug patient used on Father's Day.  Drug screen negative for cocaine.  CK levels are improving  #3 urinary tract infection, on antibiotics culture pending  #4 history of diabetes mellitus with hemoglobin A1c of 12.8  #5 polysubstance abuse    Plan:    #1 continue IV hydration with LR at 150 cc an hour  #2 strict intake output charting  #3 monitor electrolytes and renal functions daily  #4 replace electrolytes as needed  #5 avoid IV contrast nephrotoxins  #6 patient counseled regarding avoiding drugs    Discussed plan with Dr. Michell Heinrich    Please call with questions    Robyne Askew, MD FASN  Cell [phone]  Pager: [phone]  Fax   [phone]          HPI: Donald Haney is a 66 y.o. male Black / African American with history of diabetes mellitus type 2, he was found in the parking lot with significant weakness, unable to provide much history.  Apparently patient says today that he had been doing some drugs over the Father's Day and subsequently was not able to get out of bed.  He had been in the bed for hours.  On presentation to the emergency room he was noted to have a AKI with creatinine of 1.9.  His CK levels were more than 50,000, possible UTI, started on IV antibiotics and admitted.  Nephrology was consulted for AKI and rhabdomyolysis       No past medical history on file.   No past surgical history on file.    Social History     Socioeconomic History    Marital status: Divorced     Spouse name: Not on file    Number of children: Not on file    Years of education: Not on file    Highest education level: Not on file   Occupational History    Not on file    Tobacco Use    Smoking status: Not on file    Smokeless tobacco: Not on file   Substance and Sexual Activity    Alcohol use: Not on file    Drug use: Not on file    Sexual activity: Not on file   Other Topics Concern    Not on file   Social History Narrative    Not on file     Social Determinants of Health     Financial Resource Strain: Not on file   Food Insecurity: Not on file   Transportation Needs: Not on file   Physical Activity: Not on file   Stress: Not on file   Social Connections: Not on file   Intimate Partner Violence: Not on file   Housing Stability: Not on file       No family history on file.  No Known Allergies     Home Medications:   @PTAMEDSBSHSI @    Current Inpatient Medications:     Current Facility-Administered Medications   Medication Dose Route Frequency    lactated ringers IV soln infusion  IntraVENous Continuous    insulin lispro (HUMALOG) injection vial 0-8 Units  0-8 Units SubCUTAneous TID WC    insulin lispro (HUMALOG) injection vial 0-4 Units  0-4 Units SubCUTAneous Nightly    enoxaparin (LOVENOX) injection 40 mg  40 mg SubCUTAneous Daily    ondansetron (ZOFRAN-ODT) disintegrating tablet 4 mg  4 mg Oral Q8H PRN    Or    ondansetron (ZOFRAN) injection 4 mg  4 mg IntraVENous Q6H PRN    polyethylene glycol (GLYCOLAX) packet 17 g  17 g Oral Daily PRN    acetaminophen (TYLENOL) tablet 650 mg  650 mg Oral Q6H PRN    Or    acetaminophen (TYLENOL) suppository 650 mg  650 mg Rectal Q6H PRN    cefTRIAXone (ROCEPHIN) 1,000 mg in sterile water 10 mL IV syringe  1,000 mg IntraVENous Q24H    glucose chewable tablet 16 g  4 tablet Oral PRN    dextrose bolus 10% 125 mL  125 mL IntraVENous PRN    Or    dextrose bolus 10% 250 mL  250 mL IntraVENous PRN    glucagon injection 1 mg  1 mg SubCUTAneous PRN    dextrose 10 % infusion   IntraVENous Continuous PRN         Physical Assessment:     Vitals:    11/02/21 1600 11/02/21 1900 11/02/21 1950 11/02/21 2357   BP: 137/84  130/70 126/79   Pulse: 88 94 93 97    Resp: 17  18 18    Temp: 97.1 F (36.2 C)  98.6 F (37 C) 97.2 F (36.2 C)   TempSrc: Oral  Oral Oral   SpO2: 97%  98% 97%   Weight:       Height:       PF:         @WEIGHTLAST3IP @  Admission weight: Weight - Scale: 162 lb (73.5 kg) (10/31/21 2015)      Intake/Output Summary (Last 24 hours) at 11/03/2021 0328  Last data filed at 11/02/2021 1800  Gross per 24 hour   Intake 840 ml   Output 922 ml   Net -82 ml       Patient is in no apparent distress.   HEENT: Head is normocephalic and atraumatic. Pupils are round, equal, reactive to light. Sclerae are anicteric. Oropharynx clear.   Neck: no cervical lymphadenopathy or thyromegaly.   Lungs: good air entry, clear to auscultation bilaterally. Trachea at the midline. Cardiovascular system: S1, S2, regular rate and rhythm. No murmurs, gallops or rubs. No jvd. Carotid upstroke 2 + bilaterally.   Abdomen: soft, non tender, non distended. Positive bowel sounds. No hepatosplenomegaly. No abdominal bruits.   Extremities: no clubbing, cyanosis or edema.       Data Review:    Labs: Results:       Chemistry Recent Labs     10/31/21  1747 10/31/21  2106 11/01/21  0640 11/02/21  0523   NA 143 144 141 139   K 3.3* 3.6 3.9 3.4*   CL 116* 115* 113* 110   CO2 24 23 25 25    BUN 14 14 12 11    GLOB 4.1*  --   --   --          CBC w/Diff Recent Labs     10/31/21  1747 11/01/21  0515 11/02/21  0523   WBC 13.7* 18.0* 13.9*   RBC 4.52 4.43 4.13*   HGB 13.3 12.9* 12.1*   HCT 39.2 39.0  36.5   PLT 303 253 246         Iron/Ferritin No results for input(s): IRON in the last 72 hours.    Invalid input(s): TIBCCALC   PTH/VIT D No results for input(s): PTH in the last 72 hours.    Invalid input(s): VITD           Georgiann Cocker, MD  11/03/2021  3:28 AM      November 03, 2021

## 2021-11-02 NOTE — Plan of Care (Addendum)
Problem: Discharge Planning  Goal: Discharge to home or other facility with appropriate resources  Outcome: Progressing     Problem: Safety - Adult  Goal: Free from fall injury  Outcome: Progressing     Problem: Chronic Conditions and Co-morbidities  Goal: Patient's chronic conditions and co-morbidity symptoms are monitored and maintained or improved  Outcome: Progressing     Problem: Skin/Tissue Integrity  Goal: Absence of new skin breakdown  Description: 1.  Monitor for areas of redness and/or skin breakdown  2.  Assess vascular access sites hourly  3.  Every 4-6 hours minimum:  Change oxygen saturation probe site  4.  Every 4-6 hours:  If on nasal continuous positive airway pressure, respiratory therapy assess nares and determine need for appliance change or resting period.  Outcome: Progressing

## 2021-11-02 NOTE — Progress Notes (Addendum)
[facility] Hospitalist Group  Progress Note    Patient: Donald Haney Age: 66 y.o. DOB: Apr 08, 1956 MR#: [id number] SSN: [id number]  Date/Time: 11/02/2021     Subjective:   Patient doing well. No pain complaints. Eating and drinking well, working on disposition to shelter. Discussed with daughter, patient has a history of DKA and a hospitalization of that in [place]. Non compliant with medications. Daughter says patient stays with Alinda Money his brother. Daughter also reports patient has substance abuse issues.    Review of systems  General: No fevers or chills.  Cardiovascular: No chest pain or pressure. No palpitations.   Pulmonary: No shortness of breath, cough or wheeze.   Gastrointestinal: No abdominal pain, nausea, vomiting or diarrhea.   Genitourinary: No urinary frequency, urgency, hesitancy or dysuria.   Musculoskeletal: No joint or muscle pain, no back pain, no recent trauma.    Neurologic: No headache, numbness, tingling or weakness.   Assessment/Plan:   Rhabdomyolysis-continue IV fluids, started on bicarbonate gtt. in the ER, will follow CK levels.check uric acid  AKI-likely prerenal, continue IV fluids, continue to monitor.likely consult nephrology in the AM.  UTI-IV Rocephin, redraw urine cx.  Homeless-Case management consultation for placement.  Bruising and abrasion on bilateral knees-wound care consultation in AM.  History of type 2 diabetes - A1c is at 12.8, insulin sliding scale, change to moderate SSI.  Substance abuse- UDS  DVT prophylaxis-Lovenox  Full code      I spent 40 minutes with the patient in face-to-face consultation, of which greater than 50% was spent in counseling and coordination of care as described above.    Case discussed with:  [x] Patient  [] Family  [] Nursing  [] Case Management  DVT Prophylaxis:  [x] Lovenox  [] Hep SQ  [] SCDs  [] Coumadin   [] Eliquis/Xarelto     Objective:   VS: BP 123/67   Pulse 66   Temp 98 F (36.7 C) (Oral)   Resp 18   Ht 5\' 10"  (1.778  m)   Wt 162 lb (73.5 kg)   SpO2 94%   PF (!) 18 L/min   BMI 23.24 kg/m    Tmax/24hrs: Temp (24hrs), Avg:98.4 F (36.9 C), Min:97.9 F (36.6 C), Max:99.3 F (37.4 C)  IOBRIEF  Intake/Output Summary (Last 24 hours) at 11/02/2021 1211  Last data filed at 11/02/2021 1039  Gross per 24 hour   Intake 320 ml   Output 1400 ml   Net -1080 ml       General:  Alert, cooperative, no acute distress    HEENT: PERRLA, anicteric sclerae.Abrasion to face as well  Pulmonary:  CTA Bilaterally. No Wheezing/Rales.  Cardiovascular: Regular rate and Rhythm.  GI:  Soft, Non distended, Non tender. + Bowel sounds.  Extremities:  No edema. No calf tenderness. Patient has abrasions on both knees  Psych: Good insight. Not anxious or agitated.  Neurologic: Alert and oriented X 3. Moves all ext.  Additional:    Medications:   Current Facility-Administered Medications   Medication Dose Route Frequency    lactated ringers IV soln infusion   IntraVENous Continuous    insulin lispro (HUMALOG) injection vial 0-8 Units  0-8 Units SubCUTAneous TID WC    insulin lispro (HUMALOG) injection vial 0-4 Units  0-4 Units SubCUTAneous Nightly    enoxaparin (LOVENOX) injection 40 mg  40 mg SubCUTAneous Daily    ondansetron (ZOFRAN-ODT) disintegrating tablet 4 mg  4 mg Oral Q8H PRN    Or    ondansetron (ZOFRAN) injection 4  mg  4 mg IntraVENous Q6H PRN    polyethylene glycol (GLYCOLAX) packet 17 g  17 g Oral Daily PRN    acetaminophen (TYLENOL) tablet 650 mg  650 mg Oral Q6H PRN    Or    acetaminophen (TYLENOL) suppository 650 mg  650 mg Rectal Q6H PRN    cefTRIAXone (ROCEPHIN) 1,000 mg in sterile water 10 mL IV syringe  1,000 mg IntraVENous Q24H    glucose chewable tablet 16 g  4 tablet Oral PRN    dextrose bolus 10% 125 mL  125 mL IntraVENous PRN    Or    dextrose bolus 10% 250 mL  250 mL IntraVENous PRN    glucagon injection 1 mg  1 mg SubCUTAneous PRN    dextrose 10 % infusion   IntraVENous Continuous PRN       Labs:    Recent Results (from the past 24  hour(s))   POCT Glucose    Collection Time: 11/01/21  1:55 PM   Result Value Ref Range    POC Glucose 108 70 - 110 mg/dL   CK    Collection Time: 11/01/21  5:24 PM   Result Value Ref Range    Total CK 30,567 (H) 39 - 308 U/L   POCT Glucose    Collection Time: 11/01/21  5:30 PM   Result Value Ref Range    POC Glucose 108 70 - 110 mg/dL   Uric Acid    Collection Time: 11/01/21  7:00 PM   Result Value Ref Range    Uric Acid 8.2 (H) 2.6 - 7.2 MG/DL   POCT Glucose    Collection Time: 11/01/21  8:51 PM   Result Value Ref Range    POC Glucose 256 (H) 70 - 110 mg/dL   Basic Metabolic Panel w/ Reflex to MG    Collection Time: 11/02/21  5:23 AM   Result Value Ref Range    Sodium 139 136 - 145 mmol/L    Potassium 3.4 (L) 3.5 - 5.5 mmol/L    Chloride 110 100 - 111 mmol/L    CO2 25 21 - 32 mmol/L    Anion Gap 4 3.0 - 18 mmol/L    Glucose 103 (H) 74 - 99 mg/dL    BUN 11 7.0 - 18 MG/DL    Creatinine 9.32 0.6 - 1.3 MG/DL    Bun/Cre Ratio 12 12 - 20      Est, Glom Filt Rate >60 >60 ml/min/1.90m2    Calcium 8.2 (L) 8.5 - 10.1 MG/DL   CBC with Auto Differential    Collection Time: 11/02/21  5:23 AM   Result Value Ref Range    WBC 13.9 (H) 4.6 - 13.2 K/uL    RBC 4.13 (L) 4.35 - 5.65 M/uL    Hemoglobin 12.1 (L) 13.0 - 16.0 g/dL    Hematocrit 35.5 73.2 - 48.0 %    MCV 88.4 78.0 - 100.0 FL    MCH 29.3 24.0 - 34.0 PG    MCHC 33.2 31.0 - 37.0 g/dL    RDW 20.2 54.2 - 70.6 %    Platelets 246 135 - 420 K/uL    MPV 10.6 9.2 - 11.8 FL    Nucleated RBCs 0.0 0 PER 100 WBC    nRBC 0.00 0.00 - 0.01 K/uL    Neutrophils % 69 40 - 73 %    Lymphocytes % 22 21 - 52 %    Monocytes % 7 3 - 10 %  Eosinophils % 2 0 - 5 %    Basophils % 0 0 - 2 %    Immature Granulocytes 0 0.0 - 0.5 %    Neutrophils Absolute 9.6 (H) 1.8 - 8.0 K/UL    Lymphocytes Absolute 3.0 0.9 - 3.6 K/UL    Monocytes Absolute 0.9 0.05 - 1.2 K/UL    Eosinophils Absolute 0.2 0.0 - 0.4 K/UL    Basophils Absolute 0.1 0.0 - 0.1 K/UL    Absolute Immature Granulocyte 0.1 (H) 0.00 - 0.04 K/UL     Differential Type AUTOMATED     CK    Collection Time: 11/02/21  5:23 AM   Result Value Ref Range    Total CK 14,192 (H) 39 - 308 U/L   Magnesium    Collection Time: 11/02/21  5:23 AM   Result Value Ref Range    Magnesium 1.8 1.6 - 2.6 mg/dL   POCT Glucose    Collection Time: 11/02/21  8:11 AM   Result Value Ref Range    POC Glucose 194 (H) 70 - 110 mg/dL       Signed By: Ferdie Ping, DO     November 02, 2021      Disclaimer: Sections of this note are dictated using utilizing voice recognition software.  Minor typographical errors may be present. If questions arise, please do not hesitate to contact me or call our department.

## 2021-11-02 NOTE — Care Coordination-Inpatient (Signed)
Case Management Assessment  Initial Evaluation    Date/Time of Evaluation: 11/02/2021 9:26 AM  Assessment Completed by: Nehan Flaum    If patient is discharged prior to next notation, then this note serves as note for discharge by case management.    Patient Name: Donald Haney                   Date of Birth: 04/20/1956  Diagnosis: Rhabdomyolysis [M62.82]  Non-traumatic rhabdomyolysis [M62.82]  Heat exposure, initial encounter [T67.9XXA]                   Date / Time: 10/31/2021  5:37 PM    Patient Admission Status: Inpatient   Readmission Risk (Low < 19, Mod (19-27), High > 27): Readmission Risk Score: 8.2    Current PCP: No primary care provider on file.  PCP verified by CM? (P) No (no PCP)    Chart Reviewed: Yes      History Provided by: (P) Patient  Patient Orientation: (P) Alert and Oriented    Patient Cognition: (P) Alert    Hospitalization in the last 30 days (Readmission):  No    If yes, Readmission Assessment in CM Navigator will be completed.    Advance Directives:      Code Status: Full Code   Patient's Primary Decision Maker is: (P) Legal Next of Kin      Discharge Planning:    Patient lives with:   Type of Home: (P) Homeless  Primary Care Giver: (P) Self  Patient Support Systems include: (P) Children   Current Financial resources: (P) Medicaid, Medicare  Current community resources: (P) None  Current services prior to admission: (P) None            Current DME:              Type of Home Care services:       ADLS  Prior functional level: (P) Independent in ADLs/IADLs  Current functional level: (P) Independent in ADLs/IADLs    PT AM-PAC:   /24  OT AM-PAC:   /24    Family can provide assistance at DC:    Would you like Case Management to discuss the discharge plan with any other family members/significant others, and if so, who? (P) Yes (Daughter: Simona Huh)  Plans to Return to Present Housing: (P) No (Homeless)  Other Identified Issues/Barriers to RETURNING to current housing: Currently  homeless  Potential Assistance needed at discharge:              Potential DME:    Patient expects to discharge to: (P) Unknown  Plan for transportation at discharge:      Financial    Payor: MEDICARE / Plan: MEDICARE PART A AND B / Product Type: *No Product type* /     Does insurance require precert for SNF: No    Potential assistance Purchasing Medications: (P) Yes  Meds-to-Beds request:      No Pharmacies Listed    Notes:    Factors facilitating achievement of predicted outcomes: Pleasant    Barriers to discharge: Homeless, no PCP, limited family support     Additional Case Management Notes: Patient is open to a shelter if availability.     The Plan for Transition of Care is related to the following treatment goals of Rhabdomyolysis [M62.82]  Non-traumatic rhabdomyolysis [M62.82]  Heat exposure, initial encounter [T67.9XXA]    IF APPLICABLE: The Patient and/or patient representative Ansley and his family were provided with  a choice of provider and agrees with the discharge plan. Freedom of choice list with basic dialogue that supports the patient's individualized plan of care/goals and shares the quality data associated with the providers was provided to:     Patient Representative Name:       The Patient and/or Patient Representative Agree with the Discharge Plan?      Varina Hulon  Case Management Department  Ph: [phone]

## 2021-11-02 NOTE — Care Coordination-Inpatient (Signed)
CM left a message for [facility] [place] [phone] ext [phone].    SW contacted patient daughter to assess. Patient has a history of living in an area for a few months and relocating. Patient is connected to the [place] for housing but will leave after a few months. He has lived in several states across the country [place] being the Able to care self with ADL/IADL but needs assistance taking medications. No substance use that daughter is aware of. Psych history of schizophrenia. Unsure what medications he is taking. He has stayed with family in past but is not consistent.    SW contacted Rev. Evans at [facility] who can accept patient tomorrow if medically prepared. [facility] has case management services as long as patient is independent. They offer medical management and psych services as well. Patient needs to arrive between 12:30 pm and 1:30 pm. Cutoff time is 2:00 pm for admissions.    Ayden Hardwick, MSW  Care Management

## 2021-11-02 NOTE — Progress Notes (Signed)
Physician Progress Note      PATIENT:               Donald Haney, Donald Haney  CSN #:                  [id number]  DOB:                       Nov 03, 1955  ADMIT DATE:       10/31/2021 5:37 PM  DISCH DATE:  Amedeo Gory  PROVIDER #:        Ferdie Ping DO          QUERY TEXT:    Pt admitted with . Pt noted to have  10/31/21 1737 1st 24 hrs -VS  Pulse 119   ,RR 25  ,92% O2 sat on room air ,LABs -WBC -18  . If possible, please document   in the progress notes and discharge summary if you are evaluating and /or   treating any of the following:      The medical record reflects the following:    Risk Factors: 66 year old male with a history of type 2 diabetes, homeless   presents to the emergency room after being found in the parking lot. Patient   is very weak, unable to give significant information. ER evaluation-patient   noted to have AKI, rhabdomyolysis, UTI, started on IV antibiotics, IV fluids,   will be admitted to telemetry unit for further evaluation    Clinical Indicators: Admitted  10/31/21 1737 1st 24 hrs -VS  Pulse 119 ,RR 25   ,92% O2 sat on room air  LABs -WBC -18 , CK - 6/19  >50,000 High   6/18-13,551 High    Treatment: IV  sodium bicarbonate 150 mEq in dextrose 5 % 1,000 mL  ,IV   infusion 0.9 % sodium chloride infusion 1000cc ,0.9 % sodium chloride bolus at   125cc/h ,IV Rocephin and SQ humalog sliding scale ,,condom cath applied per   verbal order ,admitted to telemetry unit    Thank you  Tammy Wikstrom RN CRCR CDI  , [facility] /[facility]  [email or web address]  Options provided:  -- Sepsis due to UTI , present on admission  -- Sepsis due to UTI , present on admission, now resolved  -- UTI without Sepsis  -- Other - I will add my own diagnosis  -- Disagree - Not applicable / Not valid  -- Disagree - Clinically unable to determine / Unknown  -- Refer to Clinical Documentation Reviewer    PROVIDER RESPONSE TEXT:    This patient has sepsis due to UTI which was present on admission.    Query created by: Mellissa Kohut on  11/02/2021 6:51 PM      Electronically signed by:  Ferdie Ping DO 11/02/2021 9:23 PM

## 2021-11-02 NOTE — Progress Notes (Signed)
Patient seen and examined   AKI d/t rhabdo   Improved with hydration  CK levels trending down   On IVF , continue   Patient admitted to drug abuse on fasthers day , likely cause of rhabdo     Please call with questions    Robyne Askew, MD FASN  Cell [phone]  Pager: [phone]  Fax   [phone]

## 2021-11-03 LAB — COMPREHENSIVE METABOLIC PANEL
ALT: 167 U/L — ABNORMAL HIGH (ref 16–61)
AST: 490 U/L — ABNORMAL HIGH (ref 10–38)
Albumin/Globulin Ratio: 0.7 — ABNORMAL LOW (ref 0.8–1.7)
Albumin: 2.6 g/dL — ABNORMAL LOW (ref 3.4–5.0)
Alk Phosphatase: 66 U/L (ref 45–117)
Anion Gap: 6 mmol/L (ref 3.0–18)
BUN: 8 MG/DL (ref 7.0–18)
Bun/Cre Ratio: 11 — ABNORMAL LOW (ref 12–20)
CO2: 25 mmol/L (ref 21–32)
Calcium: 9.1 MG/DL (ref 8.5–10.1)
Chloride: 107 mmol/L (ref 100–111)
Creatinine: 0.74 MG/DL (ref 0.6–1.3)
Est, Glom Filt Rate: >60 mL/min/{1.73_m2} (ref >60)
Globulin: 4 g/dL (ref 2.0–4.0)
Glucose: 103 mg/dL — ABNORMAL HIGH (ref 74–99)
Potassium: 3.6 mmol/L (ref 3.5–5.5)
Sodium: 138 mmol/L (ref 136–145)
Total Bilirubin: 0.8 MG/DL (ref 0.2–1.0)
Total Protein: 6.6 g/dL (ref 6.4–8.2)

## 2021-11-03 LAB — CBC WITH AUTO DIFFERENTIAL
Absolute Immature Granulocyte: 0 10*3/uL (ref 0.00–0.04)
Basophils %: 0 % (ref 0–2)
Basophils Absolute: 0 10*3/uL (ref 0.0–0.1)
Eosinophils %: 2 % (ref 0–5)
Eosinophils Absolute: 0.2 10*3/uL (ref 0.0–0.4)
Hematocrit: 37.7 % (ref 36.0–48.0)
Hemoglobin: 12.7 g/dL — ABNORMAL LOW (ref 13.0–16.0)
Immature Granulocytes: 0 % (ref 0.0–0.5)
Lymphocytes %: 19 % — ABNORMAL LOW (ref 21–52)
Lymphocytes Absolute: 1.9 10*3/uL (ref 0.9–3.6)
MCH: 29.6 PG (ref 24.0–34.0)
MCHC: 33.7 g/dL (ref 31.0–37.0)
MCV: 87.9 FL (ref 78.0–100.0)
MPV: 10.4 FL (ref 9.2–11.8)
Monocytes %: 9 % (ref 3–10)
Monocytes Absolute: 0.9 10*3/uL (ref 0.05–1.2)
Neutrophils %: 69 % (ref 40–73)
Neutrophils Absolute: 6.8 10*3/uL (ref 1.8–8.0)
Nucleated RBCs: 0 PER 100 WBC
Platelets: 248 10*3/uL (ref 135–420)
RBC: 4.29 M/uL — ABNORMAL LOW (ref 4.35–5.65)
RDW: 12.9 % (ref 11.6–14.5)
WBC: 9.9 10*3/uL (ref 4.6–13.2)
nRBC: 0 10*3/uL (ref 0.00–0.01)

## 2021-11-03 LAB — POCT GLUCOSE
POC Glucose: 121 mg/dL — ABNORMAL HIGH (ref 70–110)
POC Glucose: 132 mg/dL — ABNORMAL HIGH (ref 70–110)
POC Glucose: 142 mg/dL — ABNORMAL HIGH (ref 70–110)
POC Glucose: 172 mg/dL — ABNORMAL HIGH (ref 70–110)

## 2021-11-03 MED ORDER — TAMSULOSIN HCL 0.4 MG PO CAPS
0.4 MG | Freq: Every day | ORAL | Status: AC
Start: 2021-11-03 — End: 2021-11-06
  Administered 2021-11-03 – 2021-11-05 (×3): 0.4 mg via ORAL

## 2021-11-03 MED FILL — CEFTRIAXONE SODIUM 1 G IJ SOLR: 1 g | INTRAMUSCULAR | Qty: 1000

## 2021-11-03 MED FILL — TAMSULOSIN HCL 0.4 MG PO CAPS: 0.4 MG | ORAL | Qty: 1

## 2021-11-03 MED FILL — LOVENOX 40 MG/0.4ML IJ SOSY: 40 MG/0.4ML | INTRAMUSCULAR | Qty: 0.4

## 2021-11-03 NOTE — Care Coordination-Inpatient (Signed)
SW spoke with outpatient pharmacy. All medications to be filled at outpatient pharmacy for discharge. Please complete script for glucose monitoring kit, lancets and strips & insulin.     Sonnie Pawloski, MSW  Care Management

## 2021-11-03 NOTE — Progress Notes (Signed)
In Patient Progress note      Admit Date: 10/31/2021      Impression:   #1 acute kidney injury likely prerenal, improved with IV fluids  #2 rhabdomyolysis secondary to drug abuse unclear which drug patient used on Father's Day.  Drug screen negative for cocaine. CK levels are improving  #3 urinary tract infection, on antibiotics culture pending  #4 history of diabetes mellitus with hemoglobin A1c of 12.8  #5 polysubstance abuse  #6 transaminates      Plan:     #1 decrease IV hydration with LR at 75cc an hour  #2 strict intake output charting  #3 monitor electrolytes and renal functions daily  #4 replace electrolytes as needed  #5 avoid IV contrast nephrotoxins  #6 patient counseled regarding avoiding drugs     Discussed plan with Dr. Michell Heinrich     Please call with questions     Robyne Askew, MD FASN  Cell [phone]  Pager: [phone]  Fax   [phone]    Subjective:     - No acute over night events.  - respiratory - stable  - hemodynamics - stable, no pressrs  - UOP-ok  - Nutrition -ok    Objective:     BP 122/69   Pulse 76   Temp 97.3 F (36.3 C) (Oral)   Resp 18   Ht 5\' 10"  (1.778 m)   Wt 162 lb (73.5 kg)   SpO2 97%   PF (!) 18 L/min   BMI 23.24 kg/m       Intake/Output Summary (Last 24 hours) at 11/03/2021 1250  Last data filed at 11/03/2021 11/05/2021  Gross per 24 hour   Intake 2120 ml   Output 1702 ml   Net 418 ml       Physical Exam:     Gen NAD  HENT mmm  RS AEBE   CVS s1s2 wnl no JVD  Ext edema +  Skin no rashes  Neuro oriented X 3     Data Review:    Recent Labs     11/03/21  0623   WBC 9.9   RBC 4.29*   HCT 37.7   MCV 87.9   MCH 29.6   MCHC 33.7   RDW 12.9   MPV 10.4     Recent Labs     10/31/21  1747 10/31/21  2106 11/01/21  0640 11/02/21  0523 11/03/21  0623   BUN 14 14 12 11 8    K 3.3* 3.6 3.9 3.4* 3.6   NA 143 144 141 139 138   CL 116* 115* 113* 110 107   CO2 24 23 25 25 25        11/05/21, MD

## 2021-11-03 NOTE — Consults (Signed)
GASTROENTEROLOGY CONSULT        Impression:   1. Elevated LFTs in setting of rhabdomyolysis/shock vs drug-induced  2. Rhabdomyolysis   3. AKI  4. Illicit drug use by personal account  5. Colorectal cancer screening - due      Plan:     1. RUQ [place] as ordered  2. Liver serologies ordered. Repeat LFTs/INR in the morning  3. Medical management of rhabdomyolysis as per nephrologist  4. No NSAIDs. Acetaminophen use if indicated with caution.   5. Medical management per primary team.  6. Close outpatient GI follow-up with me for continued assessment and coordinate for screening colonoscopy.         Chief Complaint: elevated LFTs      HPI:  Donald Haney is a 66 y.o. male who I am being asked to see in consultation for an opinion regarding elevated LFTs. Patient admitted for medical management of rhabdomyolysis 10/31/21 with associated AKI. Patient admits recent heroin use may have caused current medical condition. He does not know if was mixed with other substances. Finding of elevated LFTs (ALT 167; AST 490)during routine chemistry lab monitoring. No nausea, vomiting, fever, chills, abdominal pain. Tolerating meals. Abdominal US ordered to be scheduled. No personal nor family history of liver disease.       Principal Problem:    Rhabdomyolysis  Resolved Problems:    * No resolved hospital problems. *      PMH:   No past medical history on file.    PSH:   No past surgical history on file.    Social HX:   Social History     Socioeconomic History    Marital status: Divorced       FHX:   No family history on file.    Allergy:   No Known Allergies      Medications:       Current Facility-Administered Medications:     tamsulosin (FLOMAX) capsule 0.4 mg, 0.4 mg, Oral, Daily, Ferd Hibbs, DO, 0.4 mg at 11/03/21 1339    lactated ringers IV soln infusion, , IntraVENous, Continuous, Brunilda Payor, MD, Last Rate: 75 mL/hr at 11/03/21 1332, Rate Change at 11/03/21 1332    insulin lispro (HUMALOG) injection vial 0-8 Units, 0-8  Units, SubCUTAneous, TID WC, Ferd Hibbs, DO    insulin lispro (HUMALOG) injection vial 0-4 Units, 0-4 Units, SubCUTAneous, Nightly, Ferd Hibbs, DO    enoxaparin (LOVENOX) injection 40 mg, 40 mg, SubCUTAneous, Daily, Jules Schick, MD, 40 mg at 11/03/21 0844    ondansetron (ZOFRAN-ODT) disintegrating tablet 4 mg, 4 mg, Oral, Q8H PRN **OR** ondansetron (ZOFRAN) injection 4 mg, 4 mg, IntraVENous, Q6H PRN, Jules Schick, MD    polyethylene glycol (GLYCOLAX) packet 17 g, 17 g, Oral, Daily PRN, Jules Schick, MD    acetaminophen (TYLENOL) tablet 650 mg, 650 mg, Oral, Q6H PRN, 650 mg at 11/02/21 0206 **OR** acetaminophen (TYLENOL) suppository 650 mg, 650 mg, Rectal, Q6H PRN, Jules Schick, MD    cefTRIAXone (ROCEPHIN) 1,000 mg in sterile water 10 mL IV syringe, 1,000 mg, IntraVENous, Q24H, Jules Schick, MD, 1,000 mg at 11/02/21 2313    glucose chewable tablet 16 g, 4 tablet, Oral, PRN, Jules Schick, MD    dextrose bolus 10% 125 mL, 125 mL, IntraVENous, PRN **OR** dextrose bolus 10% 250 mL, 250 mL, IntraVENous, PRN, Jules Schick, MD    glucagon injection 1 mg, 1 mg, SubCUTAneous, PRN, Jules Schick, MD    dextrose 10 % infusion, ,  IntraVENous, Continuous PRN, Jules Schick, MD         Review of Systems:     Constitution systems: Negative for fever, chills, weight gain, weight loss.   Eyes: Negative for visual changes.  ENT: Negative for sore throat, painful swallowing.   Respiratory: Negative for cough, hemoptysis, shortness of breath.   Cardiology: Negative for chest pain, palpitations.  GI:  Negative for constipation or diarrhea.  GU: Negative for urinary frequency, dysuria, hematuria.   Skin: Negative for rash.  Hematology: Negative for easy bruising, blood clots.    Musculo-skelatal: Negative for back pain, muscle/joint pain, weakness.  Neurologic: Negative for headaches, dizziness, vertigo, memory problems not related to HE.  Psychology: Negative for anxiety, depression.        BP  122/69   Pulse 76   Temp 97.3 F (36.3 C) (Oral)   Resp 18   Ht _0  (1.778 m)   Wt 162 lb (73.5 kg)   SpO2 97%   PF (!) 18 L/min   BMI 23.24 kg/m       Physical Assessment:   GENERAL ASSESSMENT: well developed, no deformities, in no acute distress.   SKIN: normal color, no lesions  HEAD: normocephalic  EYES: normal, anicteric  NOSE: normal external appearance and nares patent  MOUTH: moist mucosa  NECK: normal  CHEST: respiratory effort normal with no retractions or accessory muscle use  HEART: regular rate  ABDOMEN: soft, non-distended, no masses, non-acute  ANAL: deferred/not performed  EXTREMITY: no deformity or contracture, normal and symmetric movement, normal range of motion  NEURO: gross motor exam normal by observation  PSYC: judgement/insight: within normal limits. memory: within normal limits for recent and remote events. mood and affect: no evidence of depression, anxiety or agitation. orientation: oriented to time, space and person.       Recent Labs   Recent Results (from the past 24 hour(s))   POCT Glucose    Collection Time: 11/02/21  4:30 PM   Result Value Ref Range    POC Glucose 116 (H) 70 - 110 mg/dL   Urine Drug Screen    Collection Time: 11/02/21  5:30 PM   Result Value Ref Range    Benzodiazepines, Urine Negative NEG      Barbiturates, Urine Negative NEG      THC, TH-Cannabinol, Urine Negative NEG      Opiates, Urine Negative NEG      PCP, Urine Negative NEG      Cocaine, Urine Negative NEG      Amphetamine, Urine Negative NEG      Methadone, Urine Negative NEG      Comments: (NOTE)    POCT Glucose    Collection Time: 11/02/21  8:19 PM   Result Value Ref Range    POC Glucose 172 (H) 70 - 110 mg/dL   CBC with Auto Differential    Collection Time: 11/03/21  6:23 AM   Result Value Ref Range    WBC 9.9 4.6 - 13.2 K/uL    RBC 4.29 (L) 4.35 - 5.65 M/uL    Hemoglobin 12.7 (L) 13.0 - 16.0 g/dL    Hematocrit 37.7 36.0 - 48.0 %    MCV 87.9 78.0 - 100.0 FL    MCH 29.6 24.0 - 34.0 PG    MCHC  33.7 31.0 - 37.0 g/dL    RDW 12.9 11.6 - 14.5 %    Platelets 248 135 - 420 K/uL    MPV 10.4 9.2 - 11.8 FL  Nucleated RBCs 0.0 0 PER 100 WBC    nRBC 0.00 0.00 - 0.01 K/uL    Neutrophils % 69 40 - 73 %    Lymphocytes % 19 (L) 21 - 52 %    Monocytes % 9 3 - 10 %    Eosinophils % 2 0 - 5 %    Basophils % 0 0 - 2 %    Immature Granulocytes 0 0.0 - 0.5 %    Neutrophils Absolute 6.8 1.8 - 8.0 K/UL    Lymphocytes Absolute 1.9 0.9 - 3.6 K/UL    Monocytes Absolute 0.9 0.05 - 1.2 K/UL    Eosinophils Absolute 0.2 0.0 - 0.4 K/UL    Basophils Absolute 0.0 0.0 - 0.1 K/UL    Absolute Immature Granulocyte 0.0 0.00 - 0.04 K/UL    Differential Type AUTOMATED     Comprehensive Metabolic Panel    Collection Time: 11/03/21  6:23 AM   Result Value Ref Range    Sodium 138 136 - 145 mmol/L    Potassium 3.6 3.5 - 5.5 mmol/L    Chloride 107 100 - 111 mmol/L    CO2 25 21 - 32 mmol/L    Anion Gap 6 3.0 - 18 mmol/L    Glucose 103 (H) 74 - 99 mg/dL    BUN 8 7.0 - 18 MG/DL    Creatinine 0.74 0.6 - 1.3 MG/DL    Bun/Cre Ratio 11 (L) 12 - 20      Est, Glom Filt Rate >60 >60 ml/min/1.66m    Calcium 9.1 8.5 - 10.1 MG/DL    Total Bilirubin 0.8 0.2 - 1.0 MG/DL    ALT 167 (H) 16 - 61 U/L    AST 490 (H) 10 - 38 U/L    Alk Phosphatase 66 45 - 117 U/L    Total Protein 6.6 6.4 - 8.2 g/dL    Albumin 2.6 (L) 3.4 - 5.0 g/dL    Globulin 4.0 2.0 - 4.0 g/dL    Albumin/Globulin Ratio 0.7 (L) 0.8 - 1.7     POCT Glucose    Collection Time: 11/03/21  8:15 AM   Result Value Ref Range    POC Glucose 121 (H) 70 - 110 mg/dL   POCT Glucose    Collection Time: 11/03/21 11:33 AM   Result Value Ref Range    POC Glucose 132 (H) 70 - 110 mg/dL              OTHER LABS   No results found for: IRON, TIBC, IBCT, FERR       STOOL ANALYSIS   No results found for: OB1, OB2, OB3, POCOBL, SOB       MICROBIOLOGY   Results       Procedure Component Value Units Date/Time    Culture, Urine [[[id number]]Collected: 11/02/21 1730    Order Status: Sent Specimen: Urine, clean catch Updated:  11/03/21 1219                   Radiology    No image results found.             KElza Rafter PA.   [facility] Office: [phone] Cell: [phone] [email or web address]

## 2021-11-03 NOTE — Progress Notes (Signed)
[facility]  Progress Note    Patient: Donald Haney Age: 66 y.o. DOB: 06-28-1955 MR#: [id number] SSN: [id number]  Date/Time: 11/03/2021     Subjective:   Patient doing well, no pain complaints. Will discharge tomorrow to shelter.    Review of systems  General: No fevers or chills.  Cardiovascular: No chest pain or pressure. No palpitations.   Pulmonary: No shortness of breath, cough or wheeze.   Gastrointestinal: No abdominal pain, nausea, vomiting or diarrhea.   Genitourinary: No urinary frequency, urgency, hesitancy or dysuria.   Musculoskeletal: No joint or muscle pain, no back pain, no recent trauma.    Neurologic: No headache, numbness, tingling or weakness.   Assessment/Plan:   Rhabdomyolysis-continue IV fluids, started on bicarbonate gtt. in the ER, will follow CK levels.  AKI-likely prerenal, continue IV fluids, continue to monitor.likely consult nephrology in the AM.  UTI-IV Rocephin, redraw urine cx.  Homeless-Case management consultation for placement.  Bruising and abrasion on bilateral knees-wound care consultation in AM.  History of type 2 diabetes - A1c is at 12.8, insulin sliding scale, change to moderate SSI.  Substance abuse- UDS  DVT prophylaxis-Lovenox  Full code      I spent 40 minutes with the patient in face-to-face consultation, of which greater than 50% was spent in counseling and coordination of care as described above.     Case discussed with:  '[x]' Patient  '[]' Family  '[]' Nursing  '[]' Case Management  DVT Prophylaxis:  '[x]' Lovenox  '[]' Hep SQ  '[]' SCDs  '[]' Coumadin   '[]' Eliquis/Xarelto      Objective:   VS: BP 136/76   Pulse 79   Temp 98.5 F (36.9 C) (Oral)   Resp 16   Ht '5\' 10"'  (1.778 m)   Wt 162 lb (73.5 kg)   SpO2 98%   PF (!) 18 L/min   BMI 23.24 kg/m    Tmax/24hrs: Temp (24hrs), Avg:97.7 F (36.5 C), Min:97.2 F (36.2 C), Max:98.6 F (37 C)  IOBRIEF  Intake/Output Summary (Last 24 hours) at 11/03/2021 2235  Last data filed at 11/03/2021  5364  Gross per 24 hour   Intake 1800 ml   Output 800 ml   Net 1000 ml   General:  Alert, cooperative, no acute distress    HEENT: PERRLA, anicteric sclerae.Abrasion to face as well  Pulmonary:  CTA Bilaterally. No Wheezing/Rales.  Cardiovascular: Regular rate and Rhythm.  GI:  Soft, Non distended, Non tender. + Bowel sounds.  Extremities:  No edema. No calf tenderness. Patient has abrasions on both knees  Psych: Good insight. Not anxious or agitated.  Neurologic: Alert and oriented X 3. Moves all ext.  Additional:    Medications:   Current Facility-Administered Medications   Medication Dose Route Frequency    tamsulosin (FLOMAX) capsule 0.4 mg  0.4 mg Oral Daily    lactated ringers IV soln infusion   IntraVENous Continuous    insulin lispro (HUMALOG) injection vial 0-8 Units  0-8 Units SubCUTAneous TID WC    insulin lispro (HUMALOG) injection vial 0-4 Units  0-4 Units SubCUTAneous Nightly    enoxaparin (LOVENOX) injection 40 mg  40 mg SubCUTAneous Daily    ondansetron (ZOFRAN-ODT) disintegrating tablet 4 mg  4 mg Oral Q8H PRN    Or    ondansetron (ZOFRAN) injection 4 mg  4 mg IntraVENous Q6H PRN    polyethylene glycol (GLYCOLAX) packet 17 g  17 g Oral Daily PRN    acetaminophen (TYLENOL) tablet 650 mg  650 mg Oral Q6H PRN    Or    acetaminophen (TYLENOL) suppository 650 mg  650 mg Rectal Q6H PRN    cefTRIAXone (ROCEPHIN) 1,000 mg in sterile water 10 mL IV syringe  1,000 mg IntraVENous Q24H    glucose chewable tablet 16 g  4 tablet Oral PRN    dextrose bolus 10% 125 mL  125 mL IntraVENous PRN    Or    dextrose bolus 10% 250 mL  250 mL IntraVENous PRN    glucagon injection 1 mg  1 mg SubCUTAneous PRN    dextrose 10 % infusion   IntraVENous Continuous PRN       Labs:    Recent Results (from the past 24 hour(s))   CBC with Auto Differential    Collection Time: 11/03/21  6:23 AM   Result Value Ref Range    WBC 9.9 4.6 - 13.2 K/uL    RBC 4.29 (L) 4.35 - 5.65 M/uL    Hemoglobin 12.7 (L) 13.0 - 16.0 g/dL    Hematocrit 37.7  36.0 - 48.0 %    MCV 87.9 78.0 - 100.0 FL    MCH 29.6 24.0 - 34.0 PG    MCHC 33.7 31.0 - 37.0 g/dL    RDW 12.9 11.6 - 14.5 %    Platelets 248 135 - 420 K/uL    MPV 10.4 9.2 - 11.8 FL    Nucleated RBCs 0.0 0 PER 100 WBC    nRBC 0.00 0.00 - 0.01 K/uL    Neutrophils % 69 40 - 73 %    Lymphocytes % 19 (L) 21 - 52 %    Monocytes % 9 3 - 10 %    Eosinophils % 2 0 - 5 %    Basophils % 0 0 - 2 %    Immature Granulocytes 0 0.0 - 0.5 %    Neutrophils Absolute 6.8 1.8 - 8.0 K/UL    Lymphocytes Absolute 1.9 0.9 - 3.6 K/UL    Monocytes Absolute 0.9 0.05 - 1.2 K/UL    Eosinophils Absolute 0.2 0.0 - 0.4 K/UL    Basophils Absolute 0.0 0.0 - 0.1 K/UL    Absolute Immature Granulocyte 0.0 0.00 - 0.04 K/UL    Differential Type AUTOMATED     Comprehensive Metabolic Panel    Collection Time: 11/03/21  6:23 AM   Result Value Ref Range    Sodium 138 136 - 145 mmol/L    Potassium 3.6 3.5 - 5.5 mmol/L    Chloride 107 100 - 111 mmol/L    CO2 25 21 - 32 mmol/L    Anion Gap 6 3.0 - 18 mmol/L    Glucose 103 (H) 74 - 99 mg/dL    BUN 8 7.0 - 18 MG/DL    Creatinine 0.74 0.6 - 1.3 MG/DL    Bun/Cre Ratio 11 (L) 12 - 20      Est, Glom Filt Rate >60 >60 ml/min/1.64m    Calcium 9.1 8.5 - 10.1 MG/DL    Total Bilirubin 0.8 0.2 - 1.0 MG/DL    ALT 167 (H) 16 - 61 U/L    AST 490 (H) 10 - 38 U/L    Alk Phosphatase 66 45 - 117 U/L    Total Protein 6.6 6.4 - 8.2 g/dL    Albumin 2.6 (L) 3.4 - 5.0 g/dL    Globulin 4.0 2.0 - 4.0 g/dL    Albumin/Globulin Ratio 0.7 (L) 0.8 - 1.7     POCT Glucose  Collection Time: 11/03/21  8:15 AM   Result Value Ref Range    POC Glucose 121 (H) 70 - 110 mg/dL   POCT Glucose    Collection Time: 11/03/21 11:33 AM   Result Value Ref Range    POC Glucose 132 (H) 70 - 110 mg/dL   POCT Glucose    Collection Time: 11/03/21  5:39 PM   Result Value Ref Range    POC Glucose 142 (H) 70 - 110 mg/dL   POCT Glucose    Collection Time: 11/03/21  8:54 PM   Result Value Ref Range    POC Glucose 208 (H) 70 - 110 mg/dL       Signed By: Ferd Hibbs, DO     November 03, 2021      Disclaimer: Sections of this note are dictated using utilizing voice recognition software.  Minor typographical errors may be present. If questions arise, please do not hesitate to contact me or call our department.

## 2021-11-03 NOTE — Progress Notes (Addendum)
1915 Bedside and Verbal shift change  Received from [place], Charity fundraiser (outgoing nurse), to L. Marchel Foote (incoming)  Pt. Is AOX 4. IV Patent and infusing, Pt. denies pain at this time. Report included the following information SBAR, Procedure Summary, Intake/Output, MAR, Recent Results, Med Rec Status, and Cardiac Rhythm @ SR.  Will Resume care and monitor Pt. Condition.    1950 Pt. Head to Toe Assessment Done and documented.  Pt. Educated on call bell when in need of help and assistance.  Pt. verbalized understanding.   Bed Alarm.    2020  Pt not in distress.    2130  Pt made no complaints.    2230 Pt. Given bath, incontinent care, changed gown, pads and linen.    0000  Pt resting in bed, not in distress.    0200  Pt resting with eyes closed, easily awaken.    0400  Pt not in distress.    0430  Pt given bath, incontinent, changed pads, gown and linen.    0630  Pt. Able to rest and sleep well throughout the shift.       Verbal and bedside Shift changed report given to Victorino Dike, Charity fundraiser (oncoming RN) on Pt. Condition. Report consisted of patient's Situation, History, Activities, intake/output,  Background, Assessment and Recommendations(SBAR).     Information from the following report(s) Kardex, order Summary, Lab results and MAR was reviewed with the receiving nurse.    Opportunity for questions and clarification was provided.

## 2021-11-04 ENCOUNTER — Inpatient Hospital Stay: Admit: 2021-11-04 | Payer: PRIVATE HEALTH INSURANCE

## 2021-11-04 LAB — HEPATIC FUNCTION PANEL
ALT: 183 U/L — ABNORMAL HIGH (ref 16–61)
AST: 478 U/L — ABNORMAL HIGH (ref 10–38)
Albumin/Globulin Ratio: 0.6 — ABNORMAL LOW (ref 0.8–1.7)
Albumin: 2.6 g/dL — ABNORMAL LOW (ref 3.4–5.0)
Alk Phosphatase: 67 U/L (ref 45–117)
Bilirubin, Direct: 0.2 MG/DL (ref 0.0–0.2)
Globulin: 4.1 g/dL — ABNORMAL HIGH (ref 2.0–4.0)
Total Bilirubin: 0.5 MG/DL (ref 0.2–1.0)
Total Protein: 6.7 g/dL (ref 6.4–8.2)

## 2021-11-04 LAB — RENAL FUNCTION PANEL
Albumin: 2.8 g/dL — ABNORMAL LOW (ref 3.4–5.0)
Anion Gap: 8 mmol/L (ref 3.0–18)
BUN: 8 MG/DL (ref 7.0–18)
Bun/Cre Ratio: 10 — ABNORMAL LOW (ref 12–20)
CO2: 24 mmol/L (ref 21–32)
Calcium: 9 MG/DL (ref 8.5–10.1)
Chloride: 106 mmol/L (ref 100–111)
Creatinine: 0.8 MG/DL (ref 0.6–1.3)
Est, Glom Filt Rate: >60 mL/min/{1.73_m2} (ref >60)
Glucose: 134 mg/dL — ABNORMAL HIGH (ref 74–99)
Phosphorus: 3.6 MG/DL (ref 2.5–4.9)
Potassium: 3.9 mmol/L (ref 3.5–5.5)
Sodium: 138 mmol/L (ref 136–145)

## 2021-11-04 LAB — HEPATITIS PANEL, ACUTE
Hep A IgM: NEGATIVE
Hep B Core Ab, IgM: NEGATIVE
Hepatitis B Surface Ag: 0.21 Index (ref <1.00)
Hepatitis C Ab: >11 Index — ABNORMAL HIGH (ref <0.80)
Interpretation: NEGATIVE
Interpretation: POSITIVE — AB

## 2021-11-04 LAB — CBC
Hematocrit: 38.5 % (ref 36.0–48.0)
Hemoglobin: 12.7 g/dL — ABNORMAL LOW (ref 13.0–16.0)
MCH: 28.6 PG (ref 24.0–34.0)
MCHC: 33 g/dL (ref 31.0–37.0)
MCV: 86.7 FL (ref 78.0–100.0)
MPV: 10.6 FL (ref 9.2–11.8)
Nucleated RBCs: 0 PER 100 WBC
Platelets: 291 10*3/uL (ref 135–420)
RBC: 4.44 M/uL (ref 4.35–5.65)
RDW: 12.7 % (ref 11.6–14.5)
WBC: 8.6 10*3/uL (ref 4.6–13.2)
nRBC: 0 10*3/uL (ref 0.00–0.01)

## 2021-11-04 LAB — POCT GLUCOSE
POC Glucose: 208 mg/dL — ABNORMAL HIGH (ref 70–110)
POC Glucose: 134 mg/dL — ABNORMAL HIGH (ref 70–110)
POC Glucose: 145 mg/dL — ABNORMAL HIGH (ref 70–110)
POC Glucose: 127 mg/dL — ABNORMAL HIGH (ref 70–110)

## 2021-11-04 LAB — PROTIME-INR
INR: 1 (ref 0.8–1.2)
Protime: 13.3 s (ref 11.5–15.2)

## 2021-11-04 LAB — ACETAMINOPHEN LEVEL: Acetaminophen Level: <2 ug/mL — ABNORMAL LOW (ref 10.0–30.0)

## 2021-11-04 LAB — IRON AND TIBC
Iron % Saturation: 11 % — ABNORMAL LOW (ref 20–50)
Iron: 51 ug/dL (ref 50–175)
TIBC: 458 ug/dL — ABNORMAL HIGH (ref 250–450)

## 2021-11-04 LAB — SALICYLATE LEVEL: Salicylate Lvl: 1.9 MG/DL — ABNORMAL LOW (ref 2.8–20.0)

## 2021-11-04 LAB — CK: Total CK: 8791 U/L — ABNORMAL HIGH (ref 39–308)

## 2021-11-04 MED FILL — CEFTRIAXONE SODIUM 1 G IJ SOLR: 1 g | INTRAMUSCULAR | Qty: 1000

## 2021-11-04 MED FILL — TAMSULOSIN HCL 0.4 MG PO CAPS: 0.4 MG | ORAL | Qty: 1

## 2021-11-04 MED FILL — LOVENOX 40 MG/0.4ML IJ SOSY: 40 MG/0.4ML | INTRAMUSCULAR | Qty: 0.4

## 2021-11-04 NOTE — Progress Notes (Signed)
Gastroenterology follow up-Progress note    Assessment:  1. Elevated LFTs in setting of rhabdomyolysis/shock vs drug-induced  2. Rhabdomyolysis   3. AKI  4. Illicit drug use by personal account  5. Colorectal cancer screening - due    Recommendation:  1. RUQ [place] - result pending  2. Liver serologies, LFT, INR completed.  3. Medical management of rhabdomyolysis as per nephrologist  4. No NSAIDs. Acetaminophen use if indicated with caution.   5. Medical management per primary team.  6. Ok for discharge as patient has time sensitive check-in time for homeless shelter.  Close outpatient GI follow-up with me for continued assessment and coordinate for screening colonoscopy. Patient agrees to outpatient follow-up. He provided his daughter's Wylene Men) cell at [phone].    Will sign off but available as needed.  Thank you for this consultation and the opportunity to participate in the care of this patient. Please do not hesitate to call with any questions or concerns, or should event occur that may necessitate additional GI evaluation.      Subjective:  no acute GI events. Feeling better today.    ROS: Denies any fevers, chills, rash.     Patient Active Problem List   Diagnosis    Rhabdomyolysis       No Known Allergies     Current Facility-Administered Medications   Medication Dose Route Frequency    tamsulosin (FLOMAX) capsule 0.4 mg  0.4 mg Oral Daily    lactated ringers IV soln infusion   IntraVENous Continuous    insulin lispro (HUMALOG) injection vial 0-8 Units  0-8 Units SubCUTAneous TID WC    insulin lispro (HUMALOG) injection vial 0-4 Units  0-4 Units SubCUTAneous Nightly    enoxaparin (LOVENOX) injection 40 mg  40 mg SubCUTAneous Daily    ondansetron (ZOFRAN-ODT) disintegrating tablet 4 mg  4 mg Oral Q8H PRN    Or    ondansetron (ZOFRAN) injection 4 mg  4 mg IntraVENous Q6H PRN    polyethylene glycol (GLYCOLAX) packet 17 g  17 g Oral Daily PRN    acetaminophen (TYLENOL) tablet 650 mg  650 mg Oral Q6H  PRN    Or    acetaminophen (TYLENOL) suppository 650 mg  650 mg Rectal Q6H PRN    cefTRIAXone (ROCEPHIN) 1,000 mg in sterile water 10 mL IV syringe  1,000 mg IntraVENous Q24H    glucose chewable tablet 16 g  4 tablet Oral PRN    dextrose bolus 10% 125 mL  125 mL IntraVENous PRN    Or    dextrose bolus 10% 250 mL  250 mL IntraVENous PRN    glucagon injection 1 mg  1 mg SubCUTAneous PRN    dextrose 10 % infusion   IntraVENous Continuous PRN          Objective:  General: awake, alert, no distress   Neck: supple and trachea normal   Cardiovascular: normal rate     Pulmonary/Chest Wall: unlabored respiratory effort   Abdominal: benign       BP 136/76   Pulse 72   Temp 98.5 F (36.9 C) (Oral)   Resp 16   Ht 5\' 10"  (1.778 m)   Wt 162 lb (73.5 kg)   SpO2 98%   PF (!) 18 L/min   BMI 23.24 kg/m     No intake or output data in the 24 hours ending 11/04/21 1118    Data Review  Labs Result   Chemistry Recent Labs  11/02/21  0523 11/03/21  0623 11/04/21  0420   NA 139 138 138   K 3.4* 3.6 3.9   CL 110 107 106   CO2 25 25 24    BUN 11 8 8    GLOB  --  4.0 4.1*      CBC w/Diff Recent Labs     11/02/21  0523 11/03/21  0623 11/04/21  0420   WBC 13.9* 9.9 8.6   RBC 4.13* 4.29* 4.44   HGB 12.1* 12.7* 12.7*   HCT 36.5 37.7 38.5   PLT 246 248 291      Coagulation Recent Labs     11/04/21  0420   INR 1.0       Liver Enzymes Recent Labs     11/04/21  0420   ALT 183*          STOOL ANALYSIS   No results found for: OB1, OB2, OB3, POCOBL, SOB       MICROBIOLOGY   Results       Procedure Component Value Units Date/Time    Culture, Urine 11/06/21 Collected: 11/02/21 1730    Order Status: Sent Specimen: Urine, clean catch Updated: 11/03/21 2352                   Radiology  11/04/21 Result (most recent): result pending           11/05/21, PA    [facility]  Phone: [phone]  Pager: [phone]

## 2021-11-04 NOTE — Care Coordination-Inpatient (Addendum)
SW left a message with Rev. Evans at [facility] regarding patient discharging today to shelter.     9:10 am update  Rev. Evans returned call. Patient is able to arrive today at [facility] before 1:00 pm with all medications. Patient will meet with a case management social worker at arrival, connect with PCP, housing specialist.     Redding Cloe, MSW  Care Management

## 2021-11-04 NOTE — Progress Notes (Signed)
[facility] Hospitalist Group  Progress Note    Patient: Donald Haney Age: 66 y.o. DOB: [birth year] MR#: [id number] SSN: [id number]  Date/Time: 11/04/2021     Subjective:   Patient doing well, planned discharge tomorrow. Awaiting urine culture results. No pain complaints. Shelter arranged.    Review of systems  General: No fevers or chills.  Cardiovascular: No chest pain or pressure. No palpitations.   Pulmonary: No shortness of breath, cough or wheeze.   Gastrointestinal: No abdominal pain, nausea, vomiting or diarrhea.   Genitourinary: No urinary frequency, urgency, hesitancy or dysuria.   Musculoskeletal: No joint or muscle pain, no back pain, no recent trauma.    Neurologic: No headache, numbness, tingling or weakness.   Assessment/Plan:   Rhabdomyolysis-continue IV fluids, started on bicarbonate gtt. in the ER, will follow CK levels.  AKI-likely prerenal, continue IV fluids, continue to monitor.likely consult nephrology in the AM.  UTI-IV Rocephin, redraw urine cx.  Homeless-Case management consultation for placement.  Bruising and abrasion on bilateral knees-wound care consultation in AM.  History of type 2 diabetes - A1c is at 12.8, insulin sliding scale, change to moderate SSI.  Substance abuse- UDS  Elevated liver enzymes- likely relating to heroin,crack and cocaine use. Patient denies drinking alcohol. Liver US pending, GI workup in progress  DVT prophylaxis-Lovenox  Full code      I spent 40 minutes with the patient in face-to-face consultation, of which greater than 50% was spent in counseling and coordination of care as described above.    Case discussed with:  [x] Patient  [] Family  [] Nursing  [x] Case Management  DVT Prophylaxis:  [x] Lovenox  [] Hep SQ  [] SCDs  [] Coumadin   [] Eliquis/Xarelto     Objective:   VS: BP 136/76   Pulse 72   Temp 98.5 F (36.9 C) (Oral)   Resp 16   Ht 5' 10"  (1.778 m)   Wt 162 lb (73.5 kg)   SpO2 98%   PF (!) 18 L/min   BMI 23.24 kg/m     Tmax/24hrs: Temp (24hrs), Avg:98 F (36.7 C), Min:97.4 F (36.3 C), Max:98.5 F (36.9 C)  IOBRIEFNo intake or output data in the 24 hours ending 11/04/21 1135    General:  Alert, cooperative, no acute distress    HEENT: PERRLA, anicteric sclerae.Abrasion to face as well  Pulmonary:  CTA Bilaterally. No Wheezing/Rales.  Cardiovascular: Regular rate and Rhythm.  GI:  Soft, Non distended, Non tender. + Bowel sounds.  Extremities:  No edema. No calf tenderness. Patient has abrasions on both knees  Psych: Good insight. Not anxious or agitated.  Neurologic: Alert and oriented X 3. Moves all ext.  Additional:    Medications:   Current Facility-Administered Medications   Medication Dose Route Frequency    tamsulosin (FLOMAX) capsule 0.4 mg  0.4 mg Oral Daily    lactated ringers IV soln infusion   IntraVENous Continuous    insulin lispro (HUMALOG) injection vial 0-8 Units  0-8 Units SubCUTAneous TID WC    insulin lispro (HUMALOG) injection vial 0-4 Units  0-4 Units SubCUTAneous Nightly    enoxaparin (LOVENOX) injection 40 mg  40 mg SubCUTAneous Daily    ondansetron (ZOFRAN-ODT) disintegrating tablet 4 mg  4 mg Oral Q8H PRN    Or    ondansetron (ZOFRAN) injection 4 mg  4 mg IntraVENous Q6H PRN    polyethylene glycol (GLYCOLAX) packet 17 g  17 g Oral Daily PRN    acetaminophen (TYLENOL) tablet 650 mg  650 mg Oral Q6H PRN    Or    acetaminophen (TYLENOL) suppository 650 mg  650 mg Rectal Q6H PRN    cefTRIAXone (ROCEPHIN) 1,000 mg in sterile water 10 mL IV syringe  1,000 mg IntraVENous Q24H    glucose chewable tablet 16 g  4 tablet Oral PRN    dextrose bolus 10% 125 mL  125 mL IntraVENous PRN    Or    dextrose bolus 10% 250 mL  250 mL IntraVENous PRN    glucagon injection 1 mg  1 mg SubCUTAneous PRN    dextrose 10 % infusion   IntraVENous Continuous PRN       Labs:    Recent Results (from the past 24 hour(s))   POCT Glucose    Collection Time: 11/03/21  5:39 PM   Result Value Ref Range    POC Glucose 142 (H) 70 - 110 mg/dL    POCT Glucose    Collection Time: 11/03/21  8:54 PM   Result Value Ref Range    POC Glucose 208 (H) 70 - 258 mg/dL   Salicylate    Collection Time: 11/04/21  4:20 AM   Result Value Ref Range    Salicylate, Serum 1.9 (L) 2.8 - 20.0 MG/DL   Acetaminophen Level    Collection Time: 11/04/21  4:20 AM   Result Value Ref Range    Acetaminophen Level <2 (L) 10.0 - 30.0 ug/mL   Iron and TIBC    Collection Time: 11/04/21  4:20 AM   Result Value Ref Range    Iron 51 50 - 175 ug/dL    TIBC 458 (H) 250 - 450 ug/dL    Iron % Saturation 11 (L) 20 - 50 %   Hepatic Function Panel    Collection Time: 11/04/21  4:20 AM   Result Value Ref Range    Total Protein 6.7 6.4 - 8.2 g/dL    Albumin 2.6 (L) 3.4 - 5.0 g/dL    Globulin 4.1 (H) 2.0 - 4.0 g/dL    Albumin/Globulin Ratio 0.6 (L) 0.8 - 1.7      Total Bilirubin 0.5 0.2 - 1.0 MG/DL    Bilirubin, Direct 0.2 0.0 - 0.2 MG/DL    Alk Phosphatase 67 45 - 117 U/L    AST 478 (H) 10 - 38 U/L    ALT 183 (H) 16 - 61 U/L   Protime-INR    Collection Time: 11/04/21  4:20 AM   Result Value Ref Range    Protime 13.3 11.5 - 15.2 sec    INR 1.0 0.8 - 1.2     CBC    Collection Time: 11/04/21  4:20 AM   Result Value Ref Range    WBC 8.6 4.6 - 13.2 K/uL    RBC 4.44 4.35 - 5.65 M/uL    Hemoglobin 12.7 (L) 13.0 - 16.0 g/dL    Hematocrit 38.5 36.0 - 48.0 %    MCV 86.7 78.0 - 100.0 FL    MCH 28.6 24.0 - 34.0 PG    MCHC 33.0 31.0 - 37.0 g/dL    RDW 12.7 11.6 - 14.5 %    Platelets 291 135 - 420 K/uL    MPV 10.6 9.2 - 11.8 FL    Nucleated RBCs 0.0 0 PER 100 WBC    nRBC 0.00 0.00 - 0.01 K/uL   CK    Collection Time: 11/04/21  4:20 AM   Result Value Ref Range    Total CK 8,791 (H) 39 -  308 U/L   Renal Function Panel    Collection Time: 11/04/21  4:20 AM   Result Value Ref Range    Sodium 138 136 - 145 mmol/L    Potassium 3.9 3.5 - 5.5 mmol/L    Chloride 106 100 - 111 mmol/L    CO2 24 21 - 32 mmol/L    Anion Gap 8 3.0 - 18 mmol/L    Glucose 134 (H) 74 - 99 mg/dL    BUN 8 7.0 - 18 MG/DL    Creatinine 0.80 0.6 - 1.3  MG/DL    Bun/Cre Ratio 10 (L) 12 - 20      Est, Glom Filt Rate >60 >60 ml/min/1.81m    Calcium 9.0 8.5 - 10.1 MG/DL    Phosphorus 3.6 2.5 - 4.9 MG/DL    Albumin 2.8 (L) 3.4 - 5.0 g/dL   POCT Glucose    Collection Time: 11/04/21  6:57 AM   Result Value Ref Range    POC Glucose 134 (H) 70 - 110 mg/dL       Signed By: MFerd Hibbs DO     November 04, 2021      Disclaimer: Sections of this note are dictated using utilizing voice recognition software.  Minor typographical errors may be present. If questions arise, please do not hesitate to contact me or call our department.

## 2021-11-04 NOTE — Progress Notes (Signed)
In Patient Progress note      Admit Date: 10/31/2021      Impression:   #1 acute kidney injury likely prerenal, improved with IV fluids  #2 rhabdomyolysis secondary to drug abuse unclear which drug patient used on Father's Day.  Drug screen negative for cocaine. CK levels are improving  #3 urinary tract infection, on antibiotics culture pending  #4 history of diabetes mellitus with hemoglobin A1c of 12.8  #5 polysubstance abuse  #6 transaminates      Plan:     #1 d/c IVF and encourage po intake  #2 strict intake output charting  #3 monitor electrolytes and renal functions daily  #4 replace electrolytes as needed  #5 avoid IV contrast nephrotoxins  #6 patient counseled regarding avoiding drugs     Ok to be d/ ed from renal stand point   Will sign off     Discussed plan with Dr. Michell Heinrich     Please call with questions     Robyne Askew, MD FASN  Cell [phone]  Pager: [phone]  Fax   [phone]    Subjective:     - No acute over night events.  - respiratory - stable  - hemodynamics - stable, no pressrs  - UOP-ok  - Nutrition -ok    Objective:     BP 128/74   Pulse 72   Temp 98 F (36.7 C) (Oral)   Resp 17   Ht 5\' 10"  (1.778 m)   Wt 162 lb (73.5 kg)   SpO2 99%   PF (!) 18 L/min   BMI 23.24 kg/m     No intake or output data in the 24 hours ending 11/04/21 1444      Physical Exam:     Gen NAD  HENT mmm  RS AEBE   CVS s1s2 wnl no JVD  Ext edema +  Skin no rashes  Neuro oriented X 3     Data Review:    Recent Labs     11/04/21  0420   WBC 8.6   RBC 4.44   HCT 38.5   MCV 86.7   MCH 28.6   MCHC 33.0   RDW 12.7   MPV 10.6       Recent Labs     11/02/21  0523 11/03/21  0623 11/04/21  0420   BUN 11 8 8    K 3.4* 3.6 3.9   NA 139 138 138   CL 110 107 106   CO2 25 25 24    PHOS  --   --  3.6         11/06/21, MD

## 2021-11-04 NOTE — Plan of Care (Signed)
Problem: Discharge Planning  Goal: Discharge to home or other facility with appropriate resources  Outcome: Progressing  Flowsheets (Taken 11/04/2021 2000)  Discharge to home or other facility with appropriate resources: Identify barriers to discharge with patient and caregiver     Problem: Safety - Adult  Goal: Free from fall injury  Outcome: Progressing     Problem: Chronic Conditions and Co-morbidities  Goal: Patient's chronic conditions and co-morbidity symptoms are monitored and maintained or improved  Outcome: Progressing  Flowsheets (Taken 11/04/2021 2000)  Care Plan - Patient's Chronic Conditions and Co-Morbidity Symptoms are Monitored and Maintained or Improved: Monitor and assess patient's chronic conditions and comorbid symptoms for stability, deterioration, or improvement     Problem: Skin/Tissue Integrity  Goal: Absence of new skin breakdown  Description: 1.  Monitor for areas of redness and/or skin breakdown  2.  Assess vascular access sites hourly  3.  Every 4-6 hours minimum:  Change oxygen saturation probe site  4.  Every 4-6 hours:  If on nasal continuous positive airway pressure, respiratory therapy assess nares and determine need for appliance change or resting period.  Outcome: Progressing     Problem: Pain  Goal: Verbalizes/displays adequate comfort level or baseline comfort level  Outcome: Progressing

## 2021-11-05 LAB — POCT GLUCOSE
POC Glucose: 150 mg/dL — ABNORMAL HIGH (ref 70–110)
POC Glucose: 151 mg/dL — ABNORMAL HIGH (ref 70–110)
POC Glucose: 152 mg/dL — ABNORMAL HIGH (ref 70–110)
POC Glucose: 184 mg/dL — ABNORMAL HIGH (ref 70–110)

## 2021-11-05 LAB — COMPREHENSIVE METABOLIC PANEL
ALT: 166 U/L — ABNORMAL HIGH (ref 16–61)
AST: 295 U/L — ABNORMAL HIGH (ref 10–38)
Albumin/Globulin Ratio: 0.6 — ABNORMAL LOW (ref 0.8–1.7)
Albumin: 2.7 g/dL — ABNORMAL LOW (ref 3.4–5.0)
Alk Phosphatase: 64 U/L (ref 45–117)
Anion Gap: 7 mmol/L (ref 3.0–18)
BUN: 9 MG/DL (ref 7.0–18)
Bun/Cre Ratio: 11 — ABNORMAL LOW (ref 12–20)
CO2: 25 mmol/L (ref 21–32)
Calcium: 9.3 MG/DL (ref 8.5–10.1)
Chloride: 105 mmol/L (ref 100–111)
Creatinine: 0.82 MG/DL (ref 0.6–1.3)
Est, Glom Filt Rate: >60 mL/min/{1.73_m2} (ref >60)
Globulin: 4.5 g/dL — ABNORMAL HIGH (ref 2.0–4.0)
Glucose: 132 mg/dL — ABNORMAL HIGH (ref 74–99)
Potassium: 3.8 mmol/L (ref 3.5–5.5)
Sodium: 137 mmol/L (ref 136–145)
Total Bilirubin: 1.1 MG/DL — ABNORMAL HIGH (ref 0.2–1.0)
Total Protein: 7.2 g/dL (ref 6.4–8.2)

## 2021-11-05 LAB — CBC
Hematocrit: 41.5 % (ref 36.0–48.0)
Hemoglobin: 14.1 g/dL (ref 13.0–16.0)
MCH: 29.6 PG (ref 24.0–34.0)
MCHC: 34 g/dL (ref 31.0–37.0)
MCV: 87.2 FL (ref 78.0–100.0)
MPV: 10.2 FL (ref 9.2–11.8)
Nucleated RBCs: 0 PER 100 WBC
Platelets: 338 10*3/uL (ref 135–420)
RBC: 4.76 M/uL (ref 4.35–5.65)
RDW: 12.5 % (ref 11.6–14.5)
WBC: 9.5 10*3/uL (ref 4.6–13.2)
nRBC: 0 10*3/uL (ref 0.00–0.01)

## 2021-11-05 LAB — CULTURE, URINE: Colony count: >100000

## 2021-11-05 LAB — ANTI-SMOOTH MUSCLE ANTIBODY: Smooth Muscle Ab: 13 Units (ref 0–19)

## 2021-11-05 LAB — HEPATITIS B CORE ANTIBODY, TOTAL: Hep B Core Total Ab: NEGATIVE

## 2021-11-05 LAB — MITOCHONDRIAL ANTIBODIES, M2: Mitochondrial M2 Ab, IgG: <20 Units (ref 0.0–20.0)

## 2021-11-05 LAB — HIV 1/2 AG/AB, 4TH GENERATION,W RFLX CONFIRM: HIV 1/2 Interp: NONREACTIVE

## 2021-11-05 MED ORDER — THERAPEUTIC MULTIVIT/MINERAL PO TABS
ORAL_TABLET | Freq: Every day | ORAL | 1 refills | Status: AC
Start: 2021-11-05 — End: ?
  Filled 2021-11-05: qty 30, 30d supply, fill #0

## 2021-11-05 MED ORDER — INSULIN ASPART PROT & ASPART (70-30) 100 UNIT/ML SC SUSP
Freq: Two times a day (BID) | SUBCUTANEOUS | 1 refills | Status: AC
Start: 2021-11-05 — End: ?
  Filled 2021-11-05: qty 10, 10d supply, fill #0

## 2021-11-05 MED ORDER — INSULIN GLARGINE 100 UNIT/ML SC SOLN
100 UNIT/ML | Freq: Every evening | SUBCUTANEOUS | 3 refills | Status: AC
Start: 2021-11-05 — End: ?

## 2021-11-05 MED ORDER — INSULIN DETEMIR 100 UNIT/ML SC SOPN
100 UNIT/ML | Freq: Every evening | SUBCUTANEOUS | 3 refills | Status: DC
Start: 2021-11-05 — End: 2021-11-05

## 2021-11-05 MED ORDER — METOPROLOL SUCCINATE ER 25 MG PO TB24
25 MG | Freq: Every day | ORAL | Status: AC
Start: 2021-11-05 — End: 2021-11-06
  Administered 2021-11-05: 10:00:00 12.5 mg via ORAL

## 2021-11-05 MED ORDER — METOPROLOL SUCCINATE ER 25 MG PO TB24
25 MG | ORAL_TABLET | Freq: Every day | ORAL | 3 refills | Status: AC
Start: 2021-11-05 — End: ?
  Filled 2021-11-05: qty 30, 60d supply, fill #0

## 2021-11-05 MED ORDER — FREESTYLE FREEDOM KIT
PACK | Freq: Every day | 0 refills | Status: AC
Start: 2021-11-05 — End: ?

## 2021-11-05 MED ORDER — BLOOD GLUCOSE TEST VI STRP
ORAL_STRIP | 1 refills | Status: AC
Start: 2021-11-05 — End: ?

## 2021-11-05 MED ORDER — NOVOLOG FLEXPEN 100 UNIT/ML SC SOPN
100 UNIT/ML | Freq: Three times a day (TID) | SUBCUTANEOUS | 3 refills | Status: DC
Start: 2021-11-05 — End: 2021-11-05

## 2021-11-05 MED ORDER — PEN NEEDLES 3/16" 31G X 5 MM MISC
Freq: Every day | 3 refills | Status: DC
Start: 2021-11-05 — End: 2021-11-05

## 2021-11-05 MED ORDER — LANCETS MISC
Freq: Every day | 5 refills | Status: AC
Start: 2021-11-05 — End: ?

## 2021-11-05 MED ORDER — TAMSULOSIN HCL 0.4 MG PO CAPS
0.4 MG | ORAL_CAPSULE | Freq: Every day | ORAL | 3 refills | Status: AC
Start: 2021-11-05 — End: ?
  Filled 2021-11-05: qty 30, 30d supply, fill #0

## 2021-11-05 MED ORDER — INSULIN ASPART PROT & ASPART (70-30) 100 UNIT/ML SC SUSP
Freq: Two times a day (BID) | SUBCUTANEOUS | 1 refills | Status: DC
Start: 2021-11-05 — End: 2021-11-05

## 2021-11-05 MED ORDER — FREESTYLE FREEDOM KIT
PACK | Freq: Every day | 0 refills | Status: DC
Start: 2021-11-05 — End: 2021-11-05

## 2021-11-05 MED ORDER — THERAPEUTIC MULTIVIT/MINERAL PO TABS
Freq: Every day | ORAL | Status: AC
Start: 2021-11-05 — End: 2021-11-06
  Administered 2021-11-05: 14:00:00 1 via ORAL

## 2021-11-05 MED ORDER — METFORMIN HCL 1000 MG PO TABS
1000 | ORAL_TABLET | Freq: Two times a day (BID) | ORAL | 0 refills | Status: DC
Start: 2021-11-05 — End: 2024-03-26
  Filled 2021-11-05: qty 60, 30d supply, fill #0

## 2021-11-05 MED FILL — THERAPEUTIC-M PO TABS: ORAL | Qty: 1

## 2021-11-05 MED FILL — TAMSULOSIN HCL 0.4 MG PO CAPS: 0.4 MG | ORAL | Qty: 1

## 2021-11-05 MED FILL — LOVENOX 40 MG/0.4ML IJ SOSY: 40 MG/0.4ML | INTRAMUSCULAR | Qty: 0.4

## 2021-11-05 MED FILL — ACETAMINOPHEN 325 MG PO TABS: 325 MG | ORAL | Qty: 2

## 2021-11-05 MED FILL — METOPROLOL SUCCINATE ER 25 MG PO TB24: 25 MG | ORAL | Qty: 1

## 2021-11-05 MED FILL — CEFTRIAXONE SODIUM 1 G IJ SOLR: 1 g | INTRAMUSCULAR | Qty: 1000

## 2021-11-05 MED FILL — METOPROLOL SUCCINATE ER 25MG TB24: 25 MG | 60 days supply | Qty: 30 | Fill #0 | Status: AC

## 2021-11-05 MED FILL — TAMSULOSIN HCL 0.4MG CAPS: 0.4 MG | 30 days supply | Qty: 30 | Fill #0 | Status: AC

## 2021-11-05 MED FILL — CERTAVITE/ANTIOXIDANTS  TABS: 30 days supply | Qty: 30 | Fill #0 | Status: AC

## 2021-11-05 MED FILL — METFORMIN HCL 1000MG TABS: 1000 MG | 30 days supply | Qty: 60 | Fill #0 | Status: AC

## 2021-11-05 MED FILL — HUMULIN 70/30 100UNIT/ML SUSP: (70-30) 100 UNIT/ML | 10 days supply | Qty: 10 | Fill #0 | Status: AC

## 2021-11-05 MED FILL — TRUEPLUS INSULIN S 31G X 5/16" MISC: 31G X 5/16" 0.3 ML | 30 days supply | Qty: 60 | Fill #0 | Status: AC

## 2021-11-05 MED FILL — TRUEPLUS INSULIN S 31G X 5/16"" MISC: 30 days supply | Qty: 60 | Fill #0 | Status: AC

## 2021-11-05 NOTE — Discharge Summary (Signed)
AMA Summary    Patient: Donald Haney MRN: [id number]  CSN: [id number]    Date of Birth: [birth year]  Age: 66 y.o.  Sex: male    DOA: 10/31/2021 LOS:  LOS: 5 days   Discharge Date: 11/05/2021     Admission Diagnosis: Rhabdomyolysis [M62.82]  Non-traumatic rhabdomyolysis [M62.82]  Heat exposure, initial encounter [T67.9XXA]    Discharge Diagnosis:    Rhabdomyolysis-continue IV fluids, started on bicarbonate gtt. in the ER, will follow CK levels.  AKI-likely prerenal, continue IV fluids, continue to monitor.likely consult nephrology in the AM.  UTI-IV Rocephin, redraw urine cx.  Homeless-Case management consultation for placement.  Bruising and abrasion on bilateral knees-wound care consultation in AM.  History of type 2 diabetes - A1c is at 12.8, insulin sliding scale, change to moderate SSI.  Substance abuse- UDS  Elevated liver enzymes- likely relating to heroin,crack and cocaine use. Patient denies drinking alcohol. Liver US pending, GI workup in progress    Discharge Condition: Stable    PHYSICAL EXAM  Visit Vitals  BP (!) 152/93   Pulse 89   Temp 97.2 F (36.2 C) (Oral)   Resp 16   Ht '5\' 10"'  (1.778 m)   Wt 155 lb 3.3 oz (70.4 kg)   SpO2 99%   PF (!) 18 L/min   BMI 22.27 kg/m          General:  Alert, cooperative, no acute distress    HEENT: PERRLA, anicteric sclerae.Abrasion to face as well  Pulmonary:  CTA Bilaterally. No Wheezing/Rales.  Cardiovascular: Regular rate and Rhythm.  GI:  Soft, Non distended, Non tender. + Bowel sounds.  Extremities:  No edema. No calf tenderness. Patient has abrasions on both knees  Psych: Good insight. Not anxious or agitated.  Neurologic: Alert and oriented X 3. Moves all ext.  Additional:    Hospital Course: Per the H&P:66 year old male with a history of type 2 diabetes, homelessness presented to the emergency room after being found in the parking lot.  Patient was very weak at the time and unable to give a history. His ER evaluation revealed the patient had a AKI,  rhabdomyolysis, UTI, he was started on IV antibiotics, IV fluids, and was admitted to a telemetry unit for further evaluation. The patient was also started on bicarbonate gtt. Wound care was consulted for bilateral knee wounds. Case management was consulted for patient's homelessness, patient had seen the [facility] in the past but was last seen in [place], [place]. Spoke to patient's daughter and she stated patient would often roam the [place] and did not consistently take medicine or address his health. He was also reported to use drugs including heroin and crack, this was confirmed with the patient. Nephrology was consulted who recommended IV fluids and monitoring kidney labs. It was noted the patient had elevated liver enzymes likely relating to drug use. He was found to be hepatitis C positive, a liver US was ordered which showed hepatic steatosis and cholelithiasis but nothing acute causing issues. Follow up was arranged with GI outpatient. The patient's A1c was noted to be 12.8%, he reported not having a glucometer, unfortunately he had [facility] insurance and they were unable to cover his medications without a authorization process that can take multiple days. I ordered his medications in a cost conscious fashion and his daughter was willing to pay for them. The patient left AMA before they could be filled and picked up. A shelter was also arranged for the patient but it is  unclear if he actually went to the shelter on discharge. Follow up was arranged despite patient leaving AMA.    Consults: Wound care, nephrology, Gastroenterology    Significant Diagnostic Studies:   LIVER US 11/03/21  Diffusely increased hepatic echogenicity and coarsened echotexture most commonly  seen with hepatic steatosis but can be seen with other hepatocellular disease.     Right renal pelviectasis without evidence of caliectasis. Large 2.7 cm stone  within the right renal pelvis.     Increased right renal parenchymal echogenicity,  suggestive of chronic medical  renal disease.     Extensive cholelithiasis without sonographic evidence of acute cholecystitis.    Discharge Medications:     Discharge Medication List as of 11/06/2021  8:23 AM        START taking these medications    Details   Lancets MISC DAILY Starting Fri 11/05/2021, Disp-100 each, R-5, Normal      blood glucose monitor strips Test 3 times a day & as needed for symptoms of irregular blood glucose. Dispense sufficient amount for indicated testing frequency plus additional to accommodate PRN testing needs., Disp-90 strip, R-1, Normal      metoprolol succinate (TOPROL XL) 25 MG extended release tablet Take 0.5 tablets by mouth every morning (before breakfast), Disp-30 tablet, R-3Normal      tamsulosin (FLOMAX) 0.4 MG capsule Take 1 capsule by mouth daily, Disp-30 capsule, R-3Normal      Multiple Vitamins-Minerals (THERAPEUTIC MULTIVITAMIN-MINERALS) tablet Take 1 tablet by mouth daily, Disp-30 tablet, R-1Normal      insulin glargine (LANTUS) 100 UNIT/ML injection vial Inject 30 Units into the skin nightly, Disp-3 mL, R-3Print      metFORMIN (GLUCOPHAGE) 1000 MG tablet Take 1 tablet by mouth 2 times daily (with meals), Disp-60 tablet, R-0Normal           CONTINUE these medications which have CHANGED    Details   insulin aspart protamine-insulin aspart (NOVOLOG MIX 70/30) (70-30) 100 UNIT/ML injection Inject 15 Units into the skin 2 times daily (with meals), Disp-3 mL, R-1Normal           CONTINUE these medications which have NOT CHANGED    Details   glucose monitoring (FREESTYLE FREEDOM) kit DAILY Starting Fri 11/05/2021, Disp-1 kit, R-0, PrintCan substitute for whichever glucometer is more affordable             Activity: activity as tolerated    Diet: cardiac diet and diabetic diet    Wound Care: as directed    Follow-up: with PCP, No primary care provider on file. in 7-10days    Minutes spent on discharge: >30 minutes spent coordinating this discharge (review instructions/follow-up,  prescriptions, preparing report for sign off)

## 2021-11-05 NOTE — Care Coordination-Inpatient (Addendum)
SW prepared patient for discharge to [facility]. Transport was arranged through [employer] and trip # [phone].    Outpatient pharmacy contacted SW and advised patient has a [employer] that is primary and outpatient pharmacy does not accept this insurance. Patient did not qualify for indigent meds. SW spoke with Dr. Michell Heinrich about changing medications to oral diabetic meds or a cheaper insulin. Dr.Klug was able to change medications. SW spoke with daughter about covering the cost of medications which she did pay directly to outpatient pharmacy over phone.     Patient missed 2:30 pm deadline for [facility] admission and can come on Monday. CM and CM manager Burt Knack contacted [facility] and [facility] as a plan for a weekend discharge to a shelter. There were no admissions or availability over weekend.     SW went to update patient on waiting until Monday for discharge to [facility]. Patient stated he has been homeless before and will walk to a friends house. CM explained risk of leaving AMA and not having obtained medications or connecting to a local PCP. Patient stated he will not spend another night in hospital. Patient left AMA.     CM updated management and left a message with patient daughter. Meds will be saved with CM team in case patient returns over weekend.     Arica Bevilacqua, MSW  Care Management

## 2021-11-05 NOTE — Discharge Instructions (Addendum)
Hello sir you came in with rhabdomyolysis which is muscle breakdown due to drug use and dehydration. Please stop using drugs. [facility] You were also diagnosed with Hepatitis C. Please follow up with GI for treatment.

## 2021-11-05 NOTE — Progress Notes (Signed)
Physician Progress Note      PATIENT:               Donald Haney, Donald Haney  CSN #:                  [id number]  DOB:                       1955/09/01  ADMIT DATE:       10/31/2021 5:37 PM  [place] DATE:        11/05/2021 11:08 PM  RESPONDING  PROVIDER #:        Ferd Hibbs DO          QUERY TEXT:    Patient admitted with Rhabdomyolysis, Sepsis d/t UTI w/AKI ,Substance abuse.    Noted documentation of Elevated LFTs in setting of rhabdomyolysis/shock vs   drug-induced per GI Consultant .  If possible, please document in progress   notes and discharge summary if you are evaluating and/or treating any of the   following:    The medical record reflects the following:    Risk Factors:66 year old male with a history of type 2 diabetes, homeless   presents to the emergency room after being found in the parking lot. BMI-22.27   ,Substance abuse- UDS    Clinical Indicators: 6/22+6/23-GI PN Elevated LFTs in setting of   rhabdomyolysis/shock vs drug-induced  - RUQ [place] without any concerning liver lesions; steatosis noted ,Chronic   hepatitis C with Hepatitis C Ab positive - no prior treatment  6/23--MD PN Informed patient left AMA, Patient medically stable however needed   to fill his outpatient medications for it to be a safe discharge. MEds were   sent to the pharmacy early this AM.    11/03/21 06:23  Albumin: 2.6 (L)  Globulin: 4.0  ALBUMIN/GLOBULIN RATIO: 0.7 (L)  Alk Phos: 66  ALT: 167 (H)  AST: 490 (H)    11/05/21 06:49  Albumin: 2.7 (L)  Globulin: 4.5 (H)  ALBUMIN/GLOBULIN RATIO: 0.6 (L)  Alk Phos: 64  ALT: 166 (H)  AST: 295 (H)  BILIRUBIN TOTAL: 1.1 (H)        Treatment: IV FLDs w/bicarbonate gtt. , Hepatitis C PCR ordered. Other hepatic   serologies ordered. Close GI follow-up for HCV treatment.once daily   multivitamin as , RD screen . Protein snacks ordered , No NSAIDs.   Acetaminophen use if indicated with caution ,    Thank you  Tammy Wikstrom RN CRCR CDI  , [facility] /[facility]  [email or web address]  Options provided:  -- Liver  failure due to Chronic hepatitis C with Hepatitis C Ab positive  -- Acute liver failure  likely due to heroin, crack and cocaine use  -- Subacute liver failure likely due to heroin, crack and cocaine use.  -- Shock Liver likely  due to  heroin, crack and cocaine use  -- Other - I will add my own diagnosis  -- Disagree - Not applicable / Not valid  -- Disagree - Clinically unable to determine / Unknown  -- Refer to Clinical Documentation Reviewer    PROVIDER RESPONSE TEXT:    This patient has subacute liver failure which is likely due to .    Query created by: Harlan Stains on 11/05/2021 4:15 PM      Electronically signed by:  Ferd Hibbs DO 11/08/2021 11:06 AM

## 2021-11-05 NOTE — Progress Notes (Signed)
Informed patient left AMA,patient medically stable however needed to fill his outpatient medications for it to be a safe discharge.Meds were sent to the pharmacy early this AM. If he does not pick those up then he risks further decline in his health.

## 2021-11-05 NOTE — Progress Notes (Signed)
Gastroenterology follow up-Progress note    Assessment:  1. Elevated LFTs in setting of rhabdomyolysis/shock vs drug-induced    - RUQ [place] without any concerning liver lesions; steatosis noted  2. Rhabdomyolysis   3. AKI  4. Illicit drug use by personal account  5. Colorectal cancer screening - due  6. Chronic hepatitis C with Hepatitis C Ab positive - no prior treatment  7. Chronic liver disease  8. Iron deficiency  9. At risk for nutritional deficiency  10. UTI  11. Extensive cholelithiasis without radiographic finding of cholecystitis  12. Homeless      Recommendation:  1. Hepatitis C PCR ordered. Other hepatic serologies ordered. Close GI follow-up for HCV treatment.  2. Start once daily multivitamin as ordered  3. Nutritional screen would be beneficial. Protein snacks ordered  4. Medical management of rhabdomyolysis as per nephrologist  5. No NSAIDs. Acetaminophen use if indicated with caution.   6. Medical management per primary team.    Will be discharged today to homeless shelter.     Close outpatient GI follow-up with me for continued assessment and coordinate for colonoscopy. Patient agrees to outpatient follow-up. He provided his daughter's Wylene Men) cell at [phone].    Will sign off but available as needed.  Thank you for this consultation and the opportunity to participate in the care of this patient. Please do not hesitate to call with any questions or concerns, or should event occur that may necessitate additional GI evaluation.          Subjective:  no acute GI events. Feeling better today. Was tentatively scheduled for discharge yesterday due to time sensitive check-in at the homeless shelter however with confirmation of UTI, best approach was for patient to remain inpatient for IV medication.    Chronic hepatitis C - patient denies history of injecting drugs (only inhales) nor any ideas how he would have contracted HCV. He recalls years ago being told he had hepatitis C when he used  to donate blood. No prior treatment.     ROS: Denies any fevers, chills, rash.     Patient Active Problem List   Diagnosis    Rhabdomyolysis       No Known Allergies     Current Facility-Administered Medications   Medication Dose Route Frequency    metoprolol succinate (TOPROL XL) extended release tablet 12.5 mg  12.5 mg Oral QAM AC    tamsulosin (FLOMAX) capsule 0.4 mg  0.4 mg Oral Daily    lactated ringers IV soln infusion   IntraVENous Continuous    insulin lispro (HUMALOG) injection vial 0-8 Units  0-8 Units SubCUTAneous TID WC    insulin lispro (HUMALOG) injection vial 0-4 Units  0-4 Units SubCUTAneous Nightly    enoxaparin (LOVENOX) injection 40 mg  40 mg SubCUTAneous Daily    ondansetron (ZOFRAN-ODT) disintegrating tablet 4 mg  4 mg Oral Q8H PRN    Or    ondansetron (ZOFRAN) injection 4 mg  4 mg IntraVENous Q6H PRN    polyethylene glycol (GLYCOLAX) packet 17 g  17 g Oral Daily PRN    acetaminophen (TYLENOL) tablet 650 mg  650 mg Oral Q6H PRN    Or    acetaminophen (TYLENOL) suppository 650 mg  650 mg Rectal Q6H PRN    glucose chewable tablet 16 g  4 tablet Oral PRN    dextrose bolus 10% 125 mL  125 mL IntraVENous PRN    Or    dextrose bolus 10% 250 mL  250 mL IntraVENous PRN    glucagon injection 1 mg  1 mg SubCUTAneous PRN    dextrose 10 % infusion   IntraVENous Continuous PRN          Objective:  General: awake, alert, no distress   Neck: supple and trachea normal   Cardiovascular: normal rate     Pulmonary/Chest Wall: unlabored respiratory effort   Abdominal: benign       BP 93/70   Pulse 96   Temp 97.4 F (36.3 C) (Oral)   Resp 18   Ht 5\' 10"  (1.778 m)   Wt 162 lb (73.5 kg)   SpO2 100%   PF (!) 18 L/min   BMI 23.24 kg/m       Intake/Output Summary (Last 24 hours) at 11/05/2021 0837  Last data filed at 11/04/2021 1656  Gross per 24 hour   Intake --   Output 100 ml   Net -100 ml       Data Review  Labs Result   Chemistry Recent Labs     11/03/21  0623 11/04/21  0420 11/05/21  0649   NA 138 138 137    K 3.6 3.9 3.8   CL 107 106 105   CO2 25 24 25    BUN 8 8 9    GLOB 4.0 4.1* 4.5*        CBC w/Diff Recent Labs     11/03/21  0623 11/04/21  0420 11/05/21  0649   WBC 9.9 8.6 9.5   RBC 4.29* 4.44 4.76   HGB 12.7* 12.7* 14.1   HCT 37.7 38.5 41.5   PLT 248 291 338        Coagulation Recent Labs     11/04/21  0420   INR 1.0         Liver Enzymes Recent Labs     11/05/21  0649   ALT 166*            STOOL ANALYSIS   No results found for: OB1, OB2, OB3, POCOBL, SOB       MICROBIOLOGY   Results       Procedure Component Value Units Date/Time    Culture, Urine 11/07/21  (Abnormal) Collected: 11/02/21 1730    Order Status: Completed Specimen: Urine, clean catch Updated: 11/04/21 1649     Special Requests NO SPECIAL REQUESTS        Colony count --        >100,000  COLONIES/mL       Culture       POSSIBLE Enterococcus species                         Radiology  [[id number]] Result (most recent): result pending   11/04/21 Result (most recent):  11/06/21 LIVER 11/04/2021    Narrative  EXAM: US LIVER    CLINICAL INDICATION/HISTORY: 66 years Male. Eelvated LFt's  ADDITIONAL HISTORY: None    TECHNIQUE: Ultrasound of the right upper quadrant performed with grayscale and  color/spectral Doppler.    COMPARISON: No comparison study is available at the time of this dictation.    FINDINGS:    Liver: There is diffusely increased hepatic echogenicity and coarsened  echotexture. There is limited penetration of the liver which decreases  sensitivity for identification of a hepatic mass. No focal lesion appreciated.  The portal vein demonstrates normal color and Spectral Doppler with hepatopedal  flow.    Biliary: No intrahepatic biliary ductal dilatation appreciated. The  visualized  CBD measures up to 6.4 mm diameter, within normal limits for age.    Gallbladder: The gallbladder is filled with stones. There is a negative  sonographic Murphy's sign, per sonographer. No gallbladder wall thickening or  pericholecystic fluid is appreciated.    Pancreas: Portions of  the pancreas are obscured by bowel gas. The visualized  portions of the pancreas are unremarkable.    Right Kidney: The right kidney measures within normal limits in length.  Diffusely increased parenchymal echogenicity. Pelviectasis was noted at the  upper pole and interpolar region without appreciable caliectasis. Large 2.7 cm  shadowing stone within the renal pelvis. No renal mass is identified.    Visualized Aorta/IVC: No abnormality detected.    Other: None    Impression  Diffusely increased hepatic echogenicity and coarsened echotexture most commonly  seen with hepatic steatosis but can be seen with other hepatocellular disease.    Right renal pelviectasis without evidence of caliectasis. Large 2.7 cm stone  within the right renal pelvis.    Increased right renal parenchymal echogenicity, suggestive of chronic medical  renal disease.    Extensive cholelithiasis without sonographic evidence of acute cholecystitis.           Gaspar Bidding, PA    [facility]  Phone: [phone]  Pager: [phone]

## 2021-11-05 NOTE — Progress Notes (Signed)
Physician Progress Note      PATIENT:               Donald Haney, Donald Haney  CSN #:                  [id number]  DOB:                       01/24/1956  ADMIT DATE:       10/31/2021 5:37 PM  [place] DATE:        11/05/2021 11:08 PM  RESPONDING  PROVIDER #:        Ferdie Ping DO          QUERY TEXT:    Patient admitted with Non-traumatic rhabdomyolysis, Sepsis associated with UTi   ,Heat exposure and AKI-.   Noted documentation of Sepsis associated with UTI    by Attending MD on 11/02/21 and ED MD documentation patient is not septic on   10/31/21 .  If possible, please document in progress notes and discharge summary if you   are evaluating and /or treating any of the following:      The medical record reflects the following:    Risk Factors: 1st 24 hrs -VS  Pulse 119 ,RR 25 ,92% O2 sat on room air  LABs    , WBC -18.0    Clinical Indicators: ED MD PN Sun Jun 18, [id number] Vital sign abnormalities   due to dehydration and hyperthermia from exposure.  The patient is not septic.    -Admitted  10/31/21 1737 1st 24 hrs -VS  Pulse 119 ,RR 25 ,92% O2 sat on room   air  LABs -WBC -18 , CK - 6/19  >50,000 High   6/18-13,551 High    10/31/21- UA -Leukocyte Esterase, Urine: LARGE ! Appearance: CLOUDY  pH, Urine: 6.5WBC, UA: TOO NUMEROUS TO COUNT ,RBC, UA: TOO NUMEROUS TO COUNT ,  Epithelial Cells, UA: 2+ ,Bacteria, UA: 4+ !      11/02/21 UA - >100,000 COLONIES/mL  Culture ?Enterococcus faecalis?        Treatment: IV sodium bicarbonate 150 mEq in dextrose 5 % 1,000 mL  , IVNS    1000cc bolus ,IVNS at 125cc/h  IV rocephin and SQ humalog ,Labs/VS /Tele monitored carefully    Thank you  Tammy Wikstrom RN CRCR CDI  , [facility] /[facility]  [email or web address]  Options provided:  -- Sepsis associated with UTI confirmed and ED MD documentation patient is not   septic  ruled out  -- ED MD documentation patient is not septic confirmed and Sepsis associated   with UTI ruled out  -- Other - I will add my own diagnosis  -- Disagree - Not applicable / Not  valid  -- Disagree - Clinically unable to determine / Unknown  -- Refer to Clinical Documentation Reviewer    PROVIDER RESPONSE TEXT:    After study, ED MD documentation patient is not septic confirmed and Sepsis   associated with UTI ruled out.    Query created by: Jacklynn Barnacle, Tammy on 11/10/2021 12:50 PM      QUERY TEXT:    Patient admitted with Non-traumatic rhabdomyolysis, Sepsis associated with UTi   ,Heat exposure and AKI . Noted documentation of subacute liver failure in MD   PN 11/05/21  . In order to support the diagnosis of subacute liver failure  ,   please include additional clinical indicators in your documentation.  Or   please document if the  diagnosis of subacute liver failure  has been ruled out   after further study.    The medical record reflects the following:    Risk Factors: :66 year old male with a history of type 2 diabetes, homeless   presents to the emergency room after being found in the parking lot. BMI-22.27   ,Substance abuse- UDS    Clinical Indicators: Risk Factors:66 year old male with a history of type 2   diabetes, homeless presents to the emergency room after being found in the   parking lot. BMI-22.27 ,Substance abuse- UDS    Clinical Indicators: 6/22+6/23-GI PN Elevated LFTs in setting of   rhabdomyolysis/shock vs drug-induced  - RUQ [place] without any concerning liver lesions; steatosis noted ,Chronic   hepatitis C with Hepatitis C Ab positive - no prior treatment    6/23--MD PN Informed patient left AMA, Patient medically stable however needed   to fill his outpatient medications for it to be a safe discharge. Meds were   sent to the pharmacy early this AM.  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    10/31/21 17:47  ALT: 51  AST: 116 (H)    11/03/21 06:23  ALT: 167 (H)  AST: 490 (H)    11/04/21 04:20  ALT: 183 (H)  AST: 478 (H)    11/05/21 06:49  ALT: 166 (H)  AST: 295 (H)        Treatment: IV FLDs  w/bicarbonate gtt. , Hepatitis C PCR ordered. Other hepatic   serologies ordered. Close GI follow-up for HCV treatment.once daily   multivitamin as , RD screen . Protein snacks ordered , No NSAIDs.   Acetaminophen use if indicated with caution and LABs carefully monitored    Thank you  Tammy Wikstrom RN CRCR CDI  , [facility] /[facility]  [email or web address]  Options provided:  -- Subacute liver failure which is likely due to  heroin, crack and cocaine   use  present as evidenced by, Please document evidence.  -- Subacute liver failure  was ruled out  -- Other - I will add my own diagnosis  -- Disagree - Not applicable / Not valid  -- Disagree - Clinically unable to determine / Unknown  -- Refer to Clinical Documentation Reviewer    PROVIDER RESPONSE TEXT:    Subacute liver failure was ruled out after study.    Query created by: Mellissa Kohut on 11/10/2021 1:09 PM      Electronically signed by:  Ferdie Ping DO 11/13/2021 9:31 AM

## 2021-11-05 NOTE — Progress Notes (Signed)
The patient left AMA, signed AMA paperwork. Witnessed by Charity fundraiser Hydrographic surveyor). MD notified and aware.

## 2021-11-07 LAB — HEPATITIS C RNA QNT W GENOTYPE RFLX
Hepatitis C RNA Quant Log: 6.033 log10 IU/mL
Hepatitis C RNA Quantitative: 1080000 IU/mL

## 2021-11-07 LAB — HEPATITIS C GENOTYPE

## 2023-04-22 NOTE — Progress Notes (Signed)
 PERS note; responded. Consult ordered after pt arrives with verbal aggression and paranoia. PERS to see.

## 2023-04-22 NOTE — ED Triage Notes (Addendum)
 States he needs prolixin shot.   Denies SI/HI.  Agitated in triage, yelling at staff.   Endorsing paranoia, believes people are out to get him.   AVH. Requesting psych admit.     No past medical history on file.

## 2023-04-22 NOTE — ED Notes (Signed)
 Assumed care of patient post report from Jodie Echevaria, RN.    Chief complaint: AVH, paranoia, requesting prolixin shot-last shot x2 months ago    Current behavior: pt verbally aggressive in triage, pt now calm and cooperative    Dispo: awaiting MD visit     Medical

## 2023-04-22 NOTE — ED Notes (Signed)
 PSYCH PATIENT ASSUMED CARE NOTE:     Assumed care of patient post report from triage.      Patient present with wanting psych meds. Denies SI/HI.      Belongings logged and placed in: EOA closet: yes                                                        E

## 2023-04-22 NOTE — ED Notes (Signed)
 End of care.  Report given to coming shift.

## 2023-04-22 NOTE — ED Provider Notes (Signed)
 ED Resident Supervision Note    I have seen and evaluated Donald Haney and agree with the provider's findings (exceptions, if any, noted below).  I reviewed and directed the Emergency Department treatment given to Donald Haney     A/P:  will check

## 2023-04-23 NOTE — ED Notes (Signed)
 04/23/23 2000   ED Sepsis Screen   Patient assessment indicating risk for infection None   SIRS None   Other Risk Factors of Infection Diabetes   Pain   (0-10) Pain Rating: Rest 0   (0-10) Pain Goal/Acceptable Level 0   Respiratory   Airway WDL WDL   Re

## 2023-04-23 NOTE — ED Notes (Signed)
 Both EDT and this RN attempted to draw labs on pt. Pt got agitated, pointing his finger, and yelling "You are not sticking me". MD notified.

## 2023-04-23 NOTE — ED Provider Notes (Signed)
 ED Psychiatric Observation Progress Note    Observation day:  2    Status: Voluntary for psychosis pending bed    Consults: PERS    Home meds have been reconciled: Yes    Assessment/Plan:    Psychiatric diagnoses being treated:   Final diagnoses:   [email or web address]]

## 2023-04-23 NOTE — ED Notes (Signed)
 04/23/23 0000   Visual Check Flowsheet   Precaution Close Watch   Patient Behavior Calm   Patient Activity Resting with eyes closed   Intervention (BH Only) Observation   Saferoom Check (for Suicide Precautions) Complete   Risk to Harm Screening    Have

## 2023-04-23 NOTE — ED Notes (Signed)
 04/23/23 2100   Visual Check Flowsheet   Precaution Close Watch   Patient Behavior Calm   Patient Activity In room;Resting with eyes closed   Intervention (BH Only) Observation   Saferoom Check (for Suicide Precautions) Complete

## 2023-05-10 ENCOUNTER — Inpatient Hospital Stay
Admission: EM | Admit: 2023-05-10 | Discharge: 2023-05-10 | Disposition: A | Discharge status: Home / Self Care | Payer: MEDICARE | Admitting: Student in an Organized Health Care Education/Training Program

## 2023-05-10 DIAGNOSIS — R440 Auditory hallucinations: Secondary | ICD-10-CM

## 2023-05-10 LAB — URINALYSIS
Bilirubin, Urine: NEGATIVE
Glucose, Ur: NEGATIVE mg/dL
Ketones, Urine: NEGATIVE mg/dL
Nitrite, Urine: POSITIVE — AB
Protein, UA: 300 mg/dL — AB
Specific Gravity, UA: 1.019 (ref 1.005–1.030)
Urobilinogen, Urine: 1 U/dL (ref 0.2–1.0)
pH, Urine: 8 (ref 5.0–8.0)

## 2023-05-10 LAB — CBC WITH AUTO DIFFERENTIAL
Basophils %: 1 % (ref 0–2)
Basophils Absolute: 0.1 10*3/uL (ref 0.0–0.1)
Eosinophils %: 3 % (ref 0–5)
Eosinophils Absolute: 0.2 10*3/uL (ref 0.0–0.4)
Hematocrit: 43.3 % (ref 36.0–48.0)
Hemoglobin: 14.1 g/dL (ref 13.0–16.0)
Immature Granulocytes %: 0 % (ref 0.0–0.5)
Immature Granulocytes Absolute: 0 10*3/uL (ref 0.00–0.04)
Lymphocytes %: 35 % (ref 21–52)
Lymphocytes Absolute: 2.9 10*3/uL (ref 0.9–3.6)
MCH: 28.9 pg (ref 24.0–34.0)
MCHC: 32.6 g/dL (ref 31.0–37.0)
MCV: 88.7 fL (ref 78.0–100.0)
MPV: 10.2 fL (ref 9.2–11.8)
Monocytes %: 8 % (ref 3–10)
Monocytes Absolute: 0.6 10*3/uL (ref 0.05–1.2)
Neutrophils %: 54 % (ref 40–73)
Neutrophils Absolute: 4.5 10*3/uL (ref 1.8–8.0)
Nucleated RBCs: 0 /100{WBCs}
Platelets: 321 10*3/uL (ref 135–420)
RBC: 4.88 M/uL (ref 4.35–5.65)
RDW: 12.8 % (ref 11.6–14.5)
WBC: 8.3 10*3/uL (ref 4.6–13.2)
nRBC: 0 10*3/uL (ref 0.00–0.01)

## 2023-05-10 LAB — BASIC METABOLIC PANEL
Anion Gap: 5 mmol/L (ref 3.0–18)
BUN/Creatinine Ratio: 16 (ref 12–20)
BUN: 19 mg/dL — ABNORMAL HIGH (ref 7.0–18)
CO2: 28 mmol/L (ref 21–32)
Calcium: 9.1 mg/dL (ref 8.5–10.1)
Chloride: 106 mmol/L (ref 100–111)
Creatinine: 1.22 mg/dL (ref 0.6–1.3)
Est, Glom Filt Rate: 65 mL/min/{1.73_m2} (ref >60)
Glucose: 241 mg/dL — ABNORMAL HIGH (ref 74–99)
Potassium: 3.7 mmol/L (ref 3.5–5.5)
Sodium: 139 mmol/L (ref 136–145)

## 2023-05-10 LAB — URINE DRUG SCREEN
Amphetamine, Urine: NEGATIVE
Barbiturates, Urine: NEGATIVE
Benzodiazepines, Urine: NEGATIVE
Cocaine, Urine: NEGATIVE
Methadone, Urine: NEGATIVE
Opiates, Urine: NEGATIVE
Phencyclidine, Urine: NEGATIVE
THC, TH-Cannabinol, Urine: NEGATIVE

## 2023-05-10 LAB — URINALYSIS, MICRO

## 2023-05-10 LAB — ETHANOL: Ethanol Lvl: <3 mg/dL (ref 0–3)

## 2023-05-10 NOTE — ED Provider Notes (Signed)
 ED Course as of 05/12/23 0224   Wed May 10, 2023   0321 Ac to ks 67 yo male pmh of schiozphrenia, dm2, ( pt states he takes prolixin) cc- auditory hallucinations/ plan- pending labs and psych assessment.  [KS]      ED Course User Index  [KS] Adea Geisel, Lin Landsman

## 2023-05-10 NOTE — ED Provider Notes (Signed)
 [facility] EMERGENCY DEPT  EMERGENCY DEPARTMENT ENCOUNTER      Pt Name: Donald Haney  MRN: [id number]  Birthdate 22-Jul-1955  Date of evaluation: 05/10/2023  Provider: Bennie Pierini, FNP  2:36 AM    CHIEF COMPLAINT       Chief Complaint   Patient presents with

## 2023-05-10 NOTE — ED Notes (Signed)
 Pt refusing to have security place his money id or debit card in hospital safe, nor pt locker.  Pt became agitated when I just wanted a count to document, he thought I was still trying to take it.  Pt has 22.00 in bills, 1 [employer] [place] debit card, and d

## 2023-05-10 NOTE — Video Visit Notes (Signed)
 Donald Haney  [id number]  1956/03/23     EMERGENCY DEPARTMENT TELEPSYCHIATRY EVALUATION    05/10/23    Chief Complaint:  "I am schizophrenic and have not had my shot in 4 to 5 months"  HPI: Patient is a 67 y.o. African American male who presents for gastr

## 2023-05-10 NOTE — ED Notes (Signed)
 Patient changed into blue safety scrubs and wanded by security. Belongings collected (3 bags) ans secured in locker 4 top shelf

## 2023-05-10 NOTE — ED Notes (Signed)
 Pt allowed this RN to draw 3 tubes of blood.     Pt still will not provide urine specimen.

## 2023-05-10 NOTE — ED Triage Notes (Signed)
 Pt arrived via Triage ambulatory requesting his Prolixin shot 12.5 mg. States he has not had it in months.

## 2023-05-10 NOTE — ED Notes (Signed)
 Pt at nurses station yelling for his nurse.  Pt screaming that he is ready to go home.  Explained to pt that MD would be in to talk to him. Pt demanding to know how long he would be

## 2023-05-10 NOTE — Discharge Instructions (Signed)
 You were evaluated for auditory hallucinations.  Based on your work-up it was deemed that she was stable for discharge.   Please follow-up with your primary care physician if you have any further concerns and go over your work-up.  If you experience any ch

## 2023-07-05 ENCOUNTER — Inpatient Hospital Stay: Admit: 2023-07-05 | Discharge: 2023-07-06 | Disposition: A | Discharge status: Home / Self Care | Payer: MEDICARE

## 2023-07-05 DIAGNOSIS — Z5900 Homelessness unspecified: Secondary | ICD-10-CM

## 2023-07-05 DIAGNOSIS — R45851 Suicidal ideations: Secondary | ICD-10-CM

## 2023-07-05 MED ORDER — LORAZEPAM 1 MG PO TABS
1 | Freq: Once | ORAL | Status: AC
Start: 2023-07-05 — End: 2023-07-05
  Administered 2023-07-05: 1 mg via ORAL

## 2023-07-05 MED FILL — LORAZEPAM 1 MG PO TABS: 1 MG | ORAL | Qty: 1

## 2023-07-05 NOTE — ED Triage Notes (Signed)
 Pt arrives to ED via EMS complaining of auditory hallucinations. He denies SI/HI. Pt has hx of schizophrenia and reports receiving medication injection x1 week ago.

## 2023-07-05 NOTE — ED Notes (Addendum)
 Pt presents to ED ambulatory with EMS complaining of auditory hallucination, unable to obtain what pt is hearing. Pt denies SI/HI to this nurse. Pt reports he is homeless and is also looking for resources to provide him shelter. Pt states he has hx of Schizophrenia and takes medication injection but has not be able to take it for about x 1 week. Pt also has hx of diabetes and prostate cancer.  Pt denies alcohol or drug use. Pt states "I'm broke, everything is too expensive". Pt is alert and oriented x 4, RR even and unlabored, skin is warm and dry. Assessment completed and pt updated on plan of care.  Call bell in reach.      Emergency Department Nursing Plan of Care       The Nursing Plan of Care is developed from the Nursing assessment and Emergency Department Attending provider initial evaluation.  The plan of care may be reviewed in the "ED Provider note".    The Plan of Care was developed with the following considerations:   Patient / Family readiness to learn indicated ZO:[id number] understanding  Persons(s) to be included in education: patient  Barriers to Learning/Limitations:None    Signed

## 2023-07-05 NOTE — Other (Signed)
 BSMART assessment completed, and suicide risk level noted to be no risk. Primary Nurse Meg and Charge Nurse Adelina Mings and Physician [facility] notified. Concerns not observed.

## 2023-07-05 NOTE — ED Notes (Signed)
 Bedside and Verbal shift change report given to Abimael Zeiter RN (oncoming nurse) by Trula Ore RN (offgoing nurse). Report included the following information Nurse Handoff Report, Index, ED Encounter Summary, and ED SBAR.

## 2023-07-05 NOTE — ED Notes (Signed)
 Discharge instructions were given to the patient by Stephone Gum A Miyajima-Olguin, RN.    The patient left the Emergency Department ambulatory, alert and oriented and in no acute distress with 0 prescriptions. The patient was encouraged to call or return to the ED for worsening issues or problems and was encouraged to schedule a follow up appointment for continuing care.    The patient verbalized understanding of discharge instructions and prescriptions, all questions were answered. The patient has no further concerns at this time.      [facility]

## 2023-07-05 NOTE — ED Notes (Signed)
 Verbal shift change report given to Meg, RN (oncoming nurse) by Caesar Bookman (offgoing nurse). Report included the following information Nurse Handoff Report, ED SBAR, MAR, and Recent Results.

## 2023-07-05 NOTE — Other (Signed)
 BSMART consulted but unable to complete consult on this shift due to other consults taking priority. Will defer to next shift.     Dinesha Twiggs, LCSW

## 2023-07-05 NOTE — ED Provider Notes (Signed)
 [facility]GENCY DEPARTMENT  EMERGENCY DEPARTMENT ENCOUNTER       Pt Name: Donald Haney  MRN: [id number]  Birthdate [birth year]  Date of evaluation: 07/05/2023  Provider: Mosie Epstein, MD   PCP: No primary care provider on file.  Note Started: 6:53 PM EST 07/05/23     CHIEF COMPLAINT       Chief Complaint   Patient presents with    Mental Health Problem        HISTORY OF PRESENT ILLNESS: 1 or more elements      History From: Patient  HPI Limitations: Mental Health Disorder     Donald Haney is a 68 y.o. male who presents for complaints of suicidal thoughts.  He states he is a history of diabetes and schizophrenia.  He states he last got his shot for schizophrenia last month.  He reports he is homeless and has had no food today.  He states has been dealing with auditory hallucinations and worsening suicidal thoughts.  Denies homicidal ideations.  Denies visual hallucinations.  Does report tobacco use.  Denies alcohol use.  Denies any other substance use, does endorse previous history of cocaine and heroin use.  Denies cough, chest pain, shortness of breath, fever, chills, nausea, headache.  No other symptoms reported.  History and the patient is somewhat limited given his mental health disorder and he is a poor historian.     Nursing Notes were all reviewed and agreed with or any disagreements were addressed in the HPI.     REVIEW OF SYSTEMS      Review of Systems     Positives and Pertinent negatives as per HPI.    PAST HISTORY     Past Medical History:  Past Medical History:   Diagnosis Date    Diabetes mellitus (HCC)     Schizo affective schizophrenia (HCC)          Past Surgical History:  History reviewed. No pertinent surgical history.    Family History:  History reviewed. No pertinent family history.    Social History:  Social History     Tobacco Use    Smoking status: Former     Types: Cigarettes    Smokeless tobacco: Never   Substance Use Topics    Alcohol use: Not Currently    Drug use: Not  Currently     Types: Cocaine, Opiates        Allergies:  No Known Allergies    CURRENT MEDICATIONS      Discharge Medication List as of 07/05/2023  8:22 PM        CONTINUE these medications which have NOT CHANGED    Details   Lancets MISC DAILY Starting Fri 11/05/2021, Disp-100 each, R-5, Normal      blood glucose monitor strips Test 3 times a day & as needed for symptoms of irregular blood glucose. Dispense sufficient amount for indicated testing frequency plus additional to accommodate PRN testing needs., Disp-90 strip, R-1, Normal      metoprolol succinate (TOPROL XL) 25 MG extended release tablet Take 0.5 tablets by mouth every morning (before breakfast), Disp-30 tablet, R-3Normal      tamsulosin (FLOMAX) 0.4 MG capsule Take 1 capsule by mouth daily, Disp-30 capsule, R-3Normal      Multiple Vitamins-Minerals (THERAPEUTIC MULTIVITAMIN-MINERALS) tablet Take 1 tablet by mouth daily, Disp-30 tablet, R-1Normal      insulin glargine (LANTUS) 100 UNIT/ML injection vial Inject 30 Units into the skin nightly, Disp-3 mL, R-3Print  glucose monitoring (FREESTYLE FREEDOM) kit DAILY Starting Fri 11/05/2021, Disp-1 kit, R-0, PrintCan substitute for whichever glucometer is more affordable      insulin aspart protamine-insulin aspart (NOVOLOG MIX 70/30) (70-30) 100 UNIT/ML injection Inject 15 Units into the skin 2 times daily (with meals), Disp-3 mL, R-1Normal      metFORMIN (GLUCOPHAGE) 1000 MG tablet Take 1 tablet by mouth 2 times daily (with meals), Disp-60 tablet, R-0Normal             SCREENINGS               No data recorded        PHYSICAL EXAM      ED Triage Vitals [07/05/23 1811]   Encounter Vitals Group      BP (!) 122/90      Systolic BP Percentile       Diastolic BP Percentile       Pulse (!) 120      Respirations 18      Temp 98 F (36.7 C)      Temp Source Oral      SpO2 100 %      Weight       Height       Head Circumference       Peak Flow       Pain Score       Pain Loc       Pain Education       Exclude from  Growth Chart               Physical Exam  Vitals reviewed.   Constitutional:       General: He is not in acute distress.     Appearance: He is not toxic-appearing.      Comments: Unkempt   HENT:      Head: Normocephalic and atraumatic.   Eyes:      Pupils: Pupils are equal, round, and reactive to light.   Cardiovascular:      Rate and Rhythm: Regular rhythm. Tachycardia present.   Pulmonary:      Effort: Pulmonary effort is normal. No respiratory distress.   Musculoskeletal:         General: No deformity.   Neurological:      Mental Status: He is alert and oriented to person, place, and time. Mental status is at baseline.   Psychiatric:      Comments: Reports suicidal ideations, reports auditory hallucinations.  Denies visual changes, denies homicidal ideations.  Calm            DIAGNOSTIC RESULTS   LABS:     Recent Results (from the past 24 hour(s))   CBC with Auto Differential    Collection Time: 07/05/23  6:50 PM   Result Value Ref Range    WBC 10.9 4.1 - 11.1 K/uL    RBC 4.50 4.10 - 5.70 M/uL    Hemoglobin 13.1 12.1 - 17.0 g/dL    Hematocrit 13.0 86.5 - 50.3 %    MCV 88.4 80.0 - 99.0 FL    MCH 29.1 26.0 - 34.0 PG    MCHC 32.9 30.0 - 36.5 g/dL    RDW 78.4 69.6 - 29.5 %    Platelets 345 150 - 400 K/uL    MPV 10.2 8.9 - 12.9 FL    Nucleated RBCs 0.0 0 PER 100 WBC    nRBC 0.00 0.00 - 0.01 K/uL    Neutrophils % 72.4 32.0 - 75.0 %  Lymphocytes % 17.4 12.0 - 49.0 %    Monocytes % 8.9 5.0 - 13.0 %    Eosinophils % 0.5 0.0 - 7.0 %    Basophils % 0.5 0.0 - 1.0 %    Immature Granulocytes % 0.3 0.0 - 0.5 %    Neutrophils Absolute 7.93 1.80 - 8.00 K/UL    Lymphocytes Absolute 1.90 0.80 - 3.50 K/UL    Monocytes Absolute 0.97 0.00 - 1.00 K/UL    Eosinophils Absolute 0.05 0.00 - 0.40 K/UL    Basophils Absolute 0.05 0.00 - 0.10 K/UL    Immature Granulocytes Absolute 0.03 0.00 - 0.04 K/UL    Differential Type AUTOMATED     CMP    Collection Time: 07/05/23  6:50 PM   Result Value Ref Range    Sodium 136 136 - 145 mmol/L     Potassium 4.2 3.5 - 5.1 mmol/L    Chloride 99 97 - 108 mmol/L    CO2 29 21 - 32 mmol/L    Anion Gap 8 2 - 12 mmol/L    Glucose 178 (H) 65 - 100 mg/dL    BUN 29 (H) 6 - 20 MG/DL    Creatinine 4.69 (H) 0.70 - 1.30 MG/DL    BUN/Creatinine Ratio 22 (H) 12 - 20      Est, Glom Filt Rate 59 (L) >60 ml/min/1.40m2    Calcium 9.1 8.5 - 10.1 MG/DL    Total Bilirubin 0.5 0.2 - 1.0 MG/DL    ALT 53 12 - 78 U/L    AST 66 (H) 15 - 37 U/L    Alk Phosphatase 126 (H) 45 - 117 U/L    Total Protein 8.8 (H) 6.4 - 8.2 g/dL    Albumin 3.6 3.5 - 5.0 g/dL    Globulin 5.2 (H) 2.0 - 4.0 g/dL    Albumin/Globulin Ratio 0.7 (L) 1.1 - 2.2     Salicylate    Collection Time: 07/05/23  6:50 PM   Result Value Ref Range    Salicylate Lvl 2.6 (L) 2.8 - 20.0 MG/DL   ETOH    Collection Time: 07/05/23  6:50 PM   Result Value Ref Range    Ethanol Lvl <10 <10 MG/DL   Acetaminophen Level    Collection Time: 07/05/23  6:50 PM   Result Value Ref Range    Acetaminophen Level <2 (L) 10 - 30 ug/mL   Urine Drug Screen    Collection Time: 07/05/23  6:50 PM   Result Value Ref Range    Amphetamine, Urine Negative NEG      Barbiturates, Urine Negative NEG      Benzodiazepines, Urine Negative NEG      Cocaine, Urine Negative NEG      Methadone, Urine Negative NEG      Opiates, Urine Negative NEG      Phencyclidine, Urine Negative NEG      THC, TH-Cannabinol, Urine Negative NEG      Comments: (NOTE)             PROCEDURES   Unless otherwise noted below, none  Procedures              EMERGENCY DEPARTMENT COURSE and DIFFERENTIAL DIAGNOSIS/MDM   Vitals:    Vitals:    07/05/23 1811 07/05/23 2004   BP: (!) 122/90 (!) 154/91   Pulse: (!) 120 (!) 125   Resp: 18 18   Temp: 98 F (36.7 C) 98.3 F (36.8 C)   TempSrc: Oral Oral  SpO2: 100% 99%            Patient was given the following medications:  Medications   metoprolol tartrate (LOPRESSOR) tablet 12.5 mg (12.5 mg Oral Given 07/05/23 2027)   LORazepam (ATIVAN) tablet 1 mg (1 mg Oral Given 07/05/23 1850)       CONSULTS: (Who  and What was discussed)  IP CONSULT TO BSMART      CLINICAL MANAGEMENT TOOLS:  Not Applicable    Chronic Conditions:   Past Medical History:   Diagnosis Date    Diabetes mellitus (HCC)     Schizo affective schizophrenia (HCC)        Social Determinants affecting Dx or Tx: Patient lacks support at home or lives alone.  Patient is homeless.    Records Reviewed (source and summary of external notes): Old Medical Records, Nursing Notes, Prior ED/Hospital Visits    CC/HPI Summary, DDx, ED Course, and Reassessment:   MDM  68 year old male presents for suicidal ideations.  He is homeless, has a history of diabetes, schizoaffective disorder.  Per chart review, has also had a history of heroin and cocaine use.  Tachycardic on initial presentation.  No other complaints of physical pain or discomfort.  Will obtain blood work and medical clearance, will involve behavioral health for evaluation.  Differential clues mood disorder, medication compliance, substance use disorder, alcohol intoxication.  He would like 78, we will give him food and water fall obtain blood work, urine.          ED Course as of 07/05/23 2240   Wed Jul 05, 2023   4098 Patient is medically cleared.  Will need to repeat his heart rate after he has had food and water as a suspect his heart rate is elevated likely from dehydration. [DT]   2018 Behavioral health at bedside.  Will also prescribe the patient metoprolol as he has not taken it today and takes it twice daily per chart review [DT]   2020 Patient evaluated by behavioral health.  He states he does not have suicidal ideations and he states that he is only here to get established for housing because he is homeless.  He states he does not want to harm himself.  Behavioral health has cleared the patient for discharge and does not meet inpatient criteria.  I agree with this.  Will give him a dose of metoprolol now, [facility] with refills of his medications for his blood pressure  and diabetes and have follow-up with resources provided by behavioral health. [DT]      ED Course User Index  [DT] Mosie Epstein, MD     2025  The patient is tachycardic, he is having no symptoms.  He has no other symptoms or findings on blood work that would be concerning for infectious etiology.  He has not taken his metoprolol and this is likely why he is tachycardic today.  Will give his dose of metoprolol, he states he has refills of his medications and will pick them up tomorrow.  We discussed the importance of taking his metoprolol as prescribed.  Given returns for follow-up, return precautions and follow-up instructions.    FINAL IMPRESSION     1. Homeless          DISPOSITION/PLAN   DISPOSITION Decision To Discharge 07/05/2023 09:15:22 PM               Discharge Note: The patient is stable for discharge home. The signs, symptoms,  diagnosis, and discharge instructions have been discussed, understanding conveyed, and agreed upon. The patient is to follow up as recommended or return to ER should their symptoms worsen.      PATIENT REFERRED TO:  [facility] - [facility] OFFICE  [address] [place] [address]  [phone]  Schedule an appointment as soon as possible for a visit       [facility]  [address] [address]  [phone]  Call            DISCHARGE MEDICATIONS:     Medication List        CONTINUE taking these medications      glucose monitoring kit  1 kit by Does not apply route daily Can substitute for whichever glucometer is more affordable     Lancets Misc  1 each by Does not apply route daily            ASK your doctor about these medications      blood glucose test strips  Test 3 times a day & as needed for symptoms of irregular blood glucose. Dispense sufficient amount for indicated testing frequency plus additional to accommodate PRN testing needs.     insulin aspart protamine-insulin aspart (70-30)  100 UNIT/ML injection  Commonly known as: NovoLOG Mix 70/30  Inject 15 Units into the skin 2 times daily (with meals)     insulin glargine 100 UNIT/ML injection vial  Commonly known as: Lantus  Inject 30 Units into the skin nightly     metFORMIN 1000 MG tablet  Commonly known as: GLUCOPHAGE  Take 1 tablet by mouth 2 times daily (with meals)     metoprolol succinate 25 MG extended release tablet  Commonly known as: TOPROL XL  Take 0.5 tablets by mouth every morning (before breakfast)     tamsulosin 0.4 MG capsule  Commonly known as: FLOMAX  Take 1 capsule by mouth daily     therapeutic multivitamin-minerals tablet  Take 1 tablet by mouth daily                DISCONTINUED MEDICATIONS:  Discharge Medication List as of 07/05/2023  8:22 PM          I am the Primary Clinician of Record.   Mosie Epstein, MD (electronically signed)      (Please note that parts of this dictation were completed with voice recognition software. Quite often unanticipated grammatical, syntax, homophones, and other interpretive errors are inadvertently transcribed by the computer software. Please disregards these errors. Please excuse any errors that have escaped final proofreading.)         Mosie Epstein, MD  07/05/23 2241

## 2023-07-05 NOTE — Other (Signed)
 DX: Schizophrenia by hx    Triage: t arrives to ED via EMS complaining of auditory hallucinations. He denies SI/HI. Pt has hx of schizophrenia and reports receiving medication injection x1 week ago.     At bedside, patient reported coming to ED to establish housing. Patient reported being homeless for a long time. Patient denied suicidal, homicidal thoughts and hallucinations. Patient does not have any mental health providers or supports. Patient is not currently on any medications. Patient does not meet criteria for a psychiatric admission. Patient was calm and cooperative for assessment. Resources for [facility] shelter and [facility].     This Clinical research associate reviewed the [place] Suicide Severity Rating Scale in nursing flowsheet and the risk level assigned is no risk_.  Based on this assessment, the risk of suicide is no risk and the plan is_discharge.

## 2023-07-05 NOTE — ED Notes (Signed)
 Pt calm and cooperative. Pt given blue scrubs. Pt in blue scrubs. Pt's belonging in belonging bag. Security wanded pt's body and belongings. Pt's belonging in nursing station.

## 2023-07-06 LAB — CBC WITH AUTO DIFFERENTIAL
Basophils %: 0.5 % (ref 0.0–1.0)
Basophils Absolute: 0.05 10*3/uL (ref 0.00–0.10)
Eosinophils %: 0.5 % (ref 0.0–7.0)
Eosinophils Absolute: 0.05 10*3/uL (ref 0.00–0.40)
Hematocrit: 39.8 % (ref 36.6–50.3)
Hemoglobin: 13.1 g/dL (ref 12.1–17.0)
Immature Granulocytes %: 0.3 % (ref 0.0–0.5)
Immature Granulocytes Absolute: 0.03 10*3/uL (ref 0.00–0.04)
Lymphocytes %: 17.4 % (ref 12.0–49.0)
Lymphocytes Absolute: 1.9 10*3/uL (ref 0.80–3.50)
MCH: 29.1 pg (ref 26.0–34.0)
MCHC: 32.9 g/dL (ref 30.0–36.5)
MCV: 88.4 fL (ref 80.0–99.0)
MPV: 10.2 fL (ref 8.9–12.9)
Monocytes %: 8.9 % (ref 5.0–13.0)
Monocytes Absolute: 0.97 10*3/uL (ref 0.00–1.00)
Neutrophils %: 72.4 % (ref 32.0–75.0)
Neutrophils Absolute: 7.93 10*3/uL (ref 1.80–8.00)
Nucleated RBCs: 0 /100{WBCs}
Platelets: 345 10*3/uL (ref 150–400)
RBC: 4.5 M/uL (ref 4.10–5.70)
RDW: 12.5 % (ref 11.5–14.5)
WBC: 10.9 10*3/uL (ref 4.1–11.1)
nRBC: 0 10*3/uL (ref 0.00–0.01)

## 2023-07-06 LAB — SALICYLATE LEVEL: Salicylate Lvl: 2.6 mg/dL — ABNORMAL LOW (ref 2.8–20.0)

## 2023-07-06 LAB — COMPREHENSIVE METABOLIC PANEL
ALT: 53 U/L (ref 12–78)
AST: 66 U/L — ABNORMAL HIGH (ref 15–37)
Albumin/Globulin Ratio: 0.7 — ABNORMAL LOW (ref 1.1–2.2)
Albumin: 3.6 g/dL (ref 3.5–5.0)
Alk Phosphatase: 126 U/L — ABNORMAL HIGH (ref 45–117)
Anion Gap: 8 mmol/L (ref 2–12)
BUN/Creatinine Ratio: 22 — ABNORMAL HIGH (ref 12–20)
BUN: 29 mg/dL — ABNORMAL HIGH (ref 6–20)
CO2: 29 mmol/L (ref 21–32)
Calcium: 9.1 mg/dL (ref 8.5–10.1)
Chloride: 99 mmol/L (ref 97–108)
Creatinine: 1.31 mg/dL — ABNORMAL HIGH (ref 0.70–1.30)
Est, Glom Filt Rate: 59 mL/min/{1.73_m2} — ABNORMAL LOW (ref >60)
Globulin: 5.2 g/dL — ABNORMAL HIGH (ref 2.0–4.0)
Glucose: 178 mg/dL — ABNORMAL HIGH (ref 65–100)
Potassium: 4.2 mmol/L (ref 3.5–5.1)
Sodium: 136 mmol/L (ref 136–145)
Total Bilirubin: 0.5 mg/dL (ref 0.2–1.0)
Total Protein: 8.8 g/dL — ABNORMAL HIGH (ref 6.4–8.2)

## 2023-07-06 LAB — URINE DRUG SCREEN
Amphetamine, Urine: NEGATIVE
Barbiturates, Urine: NEGATIVE
Benzodiazepines, Urine: NEGATIVE
Cocaine, Urine: NEGATIVE
Methadone, Urine: NEGATIVE
Opiates, Urine: NEGATIVE
Phencyclidine, Urine: NEGATIVE
THC, TH-Cannabinol, Urine: NEGATIVE

## 2023-07-06 LAB — ACETAMINOPHEN LEVEL: Acetaminophen Level: <2 ug/mL — ABNORMAL LOW (ref 10–30)

## 2023-07-06 LAB — ETHANOL: Ethanol Lvl: <10 mg/dL (ref <10)

## 2023-07-06 MED ORDER — METOPROLOL TARTRATE 25 MG PO TABS
25 | Freq: Two times a day (BID) | ORAL | Status: DC
Start: 2023-07-06 — End: 2023-07-05
  Administered 2023-07-06: 01:00:00 12.5 mg via ORAL

## 2023-07-06 MED FILL — METOPROLOL TARTRATE 25 MG PO TABS: 25 MG | ORAL | Qty: 1

## 2023-10-08 ENCOUNTER — Emergency Department: Admit: 2023-10-08 | Payer: MEDICARE

## 2023-10-08 ENCOUNTER — Inpatient Hospital Stay
Admit: 2023-10-08 | Discharge: 2023-10-08 | Disposition: A | Discharge status: Home / Self Care | Payer: MEDICARE | Attending: Emergency Medicine

## 2023-10-08 DIAGNOSIS — R739 Hyperglycemia, unspecified: Secondary | ICD-10-CM

## 2023-10-08 LAB — COMPREHENSIVE METABOLIC PANEL
ALT: 28 U/L (ref 10–50)
AST: 32 U/L (ref 10–38)
Albumin/Globulin Ratio: 0.6 — ABNORMAL LOW (ref 0.8–1.7)
Albumin: 2.4 g/dL — ABNORMAL LOW (ref 3.4–5.0)
Alk Phosphatase: 152 U/L — ABNORMAL HIGH (ref 45–117)
Anion Gap: 15 mmol/L (ref 3.0–18.0)
BUN/Creatinine Ratio: 9 — ABNORMAL LOW (ref 12–20)
BUN: 10 mg/dL (ref 6–23)
CO2: 19 mmol/L — ABNORMAL LOW (ref 21–32)
Calcium: 8.6 mg/dL (ref 8.5–10.1)
Chloride: 90 mmol/L — ABNORMAL LOW (ref 98–107)
Creatinine: 1.14 mg/dL (ref 0.6–1.3)
Est, Glom Filt Rate: 70 mL/min/{1.73_m2} (ref >60)
Globulin: 3.8 g/dL (ref 2.0–4.0)
Glucose: 951 mg/dL (ref 74–108)
Potassium: 3.7 mmol/L (ref 3.5–5.5)
Sodium: 123 mmol/L — ABNORMAL LOW (ref 136–145)
Total Bilirubin: 0.3 mg/dL (ref 0.2–1.0)
Total Protein: 6.3 g/dL — ABNORMAL LOW (ref 6.4–8.2)

## 2023-10-08 LAB — CBC WITH AUTO DIFFERENTIAL
Basophils %: 0.6 % (ref 0.0–2.0)
Basophils Absolute: 0.04 10*3/uL (ref 0.00–0.10)
Eosinophils %: 2.6 % (ref 0.0–5.0)
Eosinophils Absolute: 0.18 10*3/uL (ref 0.00–0.40)
Hematocrit: 30.9 % — ABNORMAL LOW (ref 36.0–48.0)
Hemoglobin: 10 g/dL — ABNORMAL LOW (ref 13.0–16.0)
Immature Granulocytes %: 0.3 % (ref 0.0–0.5)
Immature Granulocytes Absolute: 0.02 10*3/uL (ref 0.00–0.04)
Lymphocytes %: 19 % — ABNORMAL LOW (ref 21.0–52.0)
Lymphocytes Absolute: 1.31 10*3/uL (ref 0.90–3.60)
MCH: 28.7 pg (ref 24.0–34.0)
MCHC: 32.4 g/dL (ref 31.0–37.0)
MCV: 88.5 FL (ref 78.0–100.0)
MPV: 10.7 FL (ref 9.2–11.8)
Monocytes %: 13.9 % — ABNORMAL HIGH (ref 3.0–10.0)
Monocytes Absolute: 0.96 10*3/uL (ref 0.05–1.20)
Neutrophils %: 63.6 % (ref 40.0–73.0)
Neutrophils Absolute: 4.4 10*3/uL (ref 1.80–8.00)
Nucleated RBCs: 0 /100{WBCs}
Platelets: 242 10*3/uL (ref 135–420)
RBC: 3.49 M/uL — ABNORMAL LOW (ref 4.35–5.65)
RDW: 12.9 % (ref 11.6–14.5)
WBC: 6.9 10*3/uL (ref 4.6–13.2)
nRBC: 0 10*3/uL (ref 0.00–0.01)

## 2023-10-08 LAB — POCT GLUCOSE
POC Glucose: >600 mg/dL (ref 70–110)
POC Glucose: >600 mg/dL (ref 70–110)
POC Glucose: 244 mg/dL — ABNORMAL HIGH (ref 70–110)

## 2023-10-08 LAB — PH, VENOUS: pH, Ven: 7.35 (ref 7.32–7.42)

## 2023-10-08 MED ORDER — SODIUM CHLORIDE 0.9 % IV BOLUS
0.9 | Freq: Once | INTRAVENOUS | Status: AC
Start: 2023-10-08 — End: 2023-10-08
  Administered 2023-10-08: 19:00:00 1000 mL via INTRAVENOUS

## 2023-10-08 MED ORDER — GLUCOSE 4 G PO CHEW
4 | ORAL | Status: DC | PRN
Start: 2023-10-08 — End: 2023-10-08

## 2023-10-08 MED ORDER — SODIUM CHLORIDE 0.9 % IV BOLUS
0.9 | Freq: Once | INTRAVENOUS | Status: AC
Start: 2023-10-08 — End: 2023-10-08
  Administered 2023-10-08: 20:00:00 1000 mL via INTRAVENOUS

## 2023-10-08 MED ORDER — GLUCAGON (RDNA) 1 MG IJ KIT
1 | INTRAMUSCULAR | Status: DC | PRN
Start: 2023-10-08 — End: 2023-10-08

## 2023-10-08 MED ORDER — SODIUM CHLORIDE 0.9 % IV SOLN
0.9 | Freq: Once | INTRAVENOUS | Status: AC
Start: 2023-10-08 — End: 2023-10-08
  Administered 2023-10-08: 18:00:00 1000 mL via INTRAVENOUS

## 2023-10-08 MED ORDER — INSULIN REGULAR HUMAN 100 UNIT/ML IJ SOLN
100 | INTRAMUSCULAR | Status: AC
Start: 2023-10-08 — End: 2023-10-08
  Administered 2023-10-08: 19:00:00 14 [IU] via INTRAVENOUS

## 2023-10-08 MED ORDER — INSULIN REGULAR HUMAN 100 UNIT/ML IJ SOLN
100 | INTRAMUSCULAR | Status: DC
Start: 2023-10-08 — End: 2023-10-08

## 2023-10-08 MED ORDER — DEXTROSE 10 % IV BOLUS
INTRAVENOUS | Status: DC | PRN
Start: 2023-10-08 — End: 2023-10-08

## 2023-10-08 MED FILL — SODIUM CHLORIDE 0.9 % IV SOLN: 0.9 % | INTRAVENOUS | Qty: 1000 | Fill #0

## 2023-10-08 NOTE — ED Notes (Addendum)
 Medicated, see MAR  Pt educated that this RN needs to perform an EKG   States "I am not here for all that- I am here to sleep."  Educated that he is undergoing medical tx att and should adhere to what is needed

## 2023-10-08 NOTE — ED Notes (Signed)
 Pt refused vitals, given medical shoe.

## 2023-10-08 NOTE — ED Provider Notes (Cosign Needed)
 [facility] EMERGENCY DEPARTMENT  EMERGENCY DEPARTMENT ENCOUNTER      Pt Name: Donald Haney  MRN: [id number]  Birthdate 1955/11/20  Date of evaluation: 10/08/2023  Provider: Rosario Commons, MD  3:50 PM    CHIEF COMPLAINT       Chief Complaint   Patient presents with    Hyperglycemia     HISTORY OF PRESENT ILLNESS    Donald Haney is a 68 y.o. male with a history of diabetes and schizoaffective schizophrenia who presents to the emergency department with elevated glucose.  Patient was at [facility] when his sugar was reading too high to count.  EMS brought him to the emergency department.  On their reviewed, glucose continued to be elevated.  Patient also complains of left ankle swelling for the last 2 weeks with associated pain and redness. States he just wants to sleep.     HPI    Nursing Notes were reviewed.    REVIEW OF SYSTEMS       Review of Systems   Constitutional:  Negative for chills and fever.   Respiratory:  Negative for cough, chest tightness, shortness of breath and wheezing.    Cardiovascular:  Positive for leg swelling (Left ankle). Negative for chest pain.   Gastrointestinal:  Negative for abdominal pain, constipation, diarrhea, nausea and vomiting.   Genitourinary:  Positive for difficulty urinating (chronic, BPH). Negative for dysuria.     Except as noted above the remainder of the review of systems was reviewed and negative.     PAST MEDICAL HISTORY     Past Medical History:   Diagnosis Date    Diabetes mellitus (HCC)     Schizo affective schizophrenia (HCC)      SURGICAL HISTORY     No past surgical history on file.    CURRENT MEDICATIONS       Previous Medications    BLOOD GLUCOSE MONITOR STRIPS    Test 3 times a day & as needed for symptoms of irregular blood glucose. Dispense sufficient amount for indicated testing frequency plus additional to accommodate PRN testing needs.    GLUCOSE MONITORING (FREESTYLE FREEDOM) KIT    1 kit by Does not apply route daily Can substitute for  whichever glucometer is more affordable    INSULIN  ASPART PROTAMINE-INSULIN  ASPART (NOVOLOG  MIX 70/30) (70-30) 100 UNIT/ML INJECTION    Inject 15 Units into the skin 2 times daily (with meals)    INSULIN  GLARGINE (LANTUS ) 100 UNIT/ML INJECTION VIAL    Inject 30 Units into the skin nightly    LANCETS MISC    1 each by Does not apply route daily    METFORMIN  (GLUCOPHAGE ) 1000 MG TABLET    Take 1 tablet by mouth 2 times daily (with meals)    METOPROLOL  SUCCINATE (TOPROL  XL) 25 MG EXTENDED RELEASE TABLET    Take 0.5 tablets by mouth every morning (before breakfast)    MULTIPLE VITAMINS-MINERALS (THERAPEUTIC MULTIVITAMIN-MINERALS) TABLET    Take 1 tablet by mouth daily    TAMSULOSIN  (FLOMAX ) 0.4 MG CAPSULE    Take 1 capsule by mouth daily     ALLERGIES     Patient has no known allergies.    FAMILY HISTORY     No family history on file.     SOCIAL HISTORY       Social History     Socioeconomic History    Marital status: Divorced   Tobacco Use    Smoking status: Former  Types: Cigarettes    Smokeless tobacco: Never   Substance and Sexual Activity    Alcohol use: Not Currently    Drug use: Not Currently     Types: Cocaine, Opiates      SCREENINGS             Glasgow Coma Scale  Eye Opening: Spontaneous  Best Verbal Response: Oriented  Best Motor Response: Obeys commands  Glasgow Coma Scale Score: 15             CIWA Assessment  BP: 137/82  Pulse: 93             PHYSICAL EXAM       ED Triage Vitals [10/08/23 1330]   BP Systolic BP Percentile Diastolic BP Percentile Temp Temp Source Pulse Respirations SpO2   121/62 -- -- 98 F (36.7 C) Oral 97 26 100 %      Height Weight - Scale         1.778 m (5\' 10" ) 74.2 kg (163 lb 8 oz)           Physical Exam  Vitals and nursing note reviewed.   Constitutional:       Appearance: Normal appearance.   HENT:      Head: Normocephalic and atraumatic.   Cardiovascular:      Rate and Rhythm: Normal rate and regular rhythm.      Pulses: Normal pulses.      Heart sounds: Normal heart sounds.    Pulmonary:      Effort: Pulmonary effort is normal.      Breath sounds: Normal breath sounds. No wheezing.   Abdominal:      General: Abdomen is flat.      Palpations: Abdomen is soft.      Tenderness: There is no abdominal tenderness. There is no right CVA tenderness or left CVA tenderness.   Musculoskeletal:      Comments: LLE swelling and erythema, some tenderness over midfoot   Skin:     General: Skin is warm.      Capillary Refill: Capillary refill takes less than 2 seconds.   Neurological:      Mental Status: He is alert.       DIAGNOSTIC RESULTS     EKG: All EKG's are interpreted by the Emergency Department Physician who either signs or Co-signs this chart in the absence of a cardiologist.    EKG with normal sinus rhythm at 85 bpm, PR interval 172, QTc 466, QRS 106, no acute ischemic changes.    RADIOLOGY:   Non-plain film images such as CT, Ultrasound and MRI are read by the radiologist. Plain radiographic images are visualized and preliminarily interpreted by the emergency physician with the below findings:    Left ankle x-ray pending.  Left lower extremity DVT ultrasound pending.    Interpretation per the Radiologist below, if available at the time of this note:    Vascular duplex lower extremity venous left for DVT    (Results Pending)   XR ANKLE LEFT (MIN 3 VIEWS)    (Results Pending)     ED BEDSIDE ULTRASOUND:   Performed by ED Physician - none    LABS:  Labs Reviewed   CBC WITH AUTO DIFFERENTIAL - Abnormal; Notable for the following components:       Result Value    RBC 3.49 (*)     Hemoglobin 10.0 (*)     Hematocrit 30.9 (*)     Lymphocytes % 19.0 (*)  Monocytes % 13.9 (*)     All other components within normal limits   COMPREHENSIVE METABOLIC PANEL - Abnormal; Notable for the following components:    Sodium 123 (*)     Chloride 90 (*)     CO2 19 (*)     Glucose 951 (*)     BUN/Creatinine Ratio 9 (*)     Alk Phosphatase 152 (*)     Total Protein 6.3 (*)     Albumin 2.4 (*)     Albumin/Globulin  Ratio 0.6 (*)     All other components within normal limits   POCT GLUCOSE - Abnormal; Notable for the following components:    POC Glucose >600 (*)     All other components within normal limits   POCT GLUCOSE - Abnormal; Notable for the following components:    POC Glucose >600 (*)     All other components within normal limits   PH, VENOUS     All other labs were within normal range or not returned as of this dictation.    EMERGENCY DEPARTMENT COURSE and DIFFERENTIAL DIAGNOSIS/MDM:   Vitals:    Vitals:    10/08/23 1448 10/08/23 1458 10/08/23 1519 10/08/23 1545   BP: 126/70 126/88 131/69 137/82   Pulse: 96 94 90 93   Resp: 19 23 17     Temp:       TempSrc:       SpO2: 100% 100%     Weight:       Height:         Medical Decision Making  Amount and/or Complexity of Data Reviewed  Labs: ordered.    Risk  Prescription drug management.      Donald Haney is a 68 y.o. male with a history of diabetes and schizoaffective schizophrenia who presents to the emergency department with elevated glucose.  Afebrile, normotensive, not tachycardic, not tachypneic, satting well on room air.  Physical exam with heart and lungs normal, abdomen soft, patient smells of urine.  Left foot and ankle with nonpitting swelling and erythema.    Differential to include, but not limited to: DKA, HHS, left foot infection, left foot DVT, left ankle fracture, arrhythmia, electrolyte abnormality    CBC with no leukocytosis, new normocytic anemia.  CMP with hyponatremia to 123, creatinine normal.    Glucose 951.  Gap 15.  AST ALT normal.  pH 7.35    EKG with normal sinus rhythm at 85 bpm, PR interval 172, QTc 466, QRS 106, no acute ischemic changes.    Left ankle x-ray pending.  Left lower extremity DVT ultrasound pending.    This patient was turned over to Dr. Letty Raya in stable condition pending left foot x-ray and left lower extremity DVT ultrasound.    REASSESSMENT        CRITICAL CARE TIME     CONSULTS:  None    PROCEDURES:  Unless otherwise noted  below, none     Procedures    FINAL IMPRESSION      1. Hyperglycemia    2. Left foot pain        DISPOSITION/PLAN   DISPOSITION           PATIENT REFERRED TO:  No follow-up provider specified.    DISCHARGE MEDICATIONS:  New Prescriptions    No medications on file     Controlled Substances Monitoring:          No data to display              (  Please note that portions of this note were completed with a voice recognition program.  Efforts were made to edit the dictations but occasionally words are mis-transcribed.)    Rosario Commons, MD (electronically signed)  Resident Emergency Physician

## 2023-10-08 NOTE — ED Notes (Signed)
 Report given to April, RN

## 2023-10-08 NOTE — ED Notes (Signed)
 EKG done and given to MD   XRAY at bedside

## 2023-10-08 NOTE — ED Triage Notes (Signed)
 Pt presented to ED via EMS transport with c/o hyperglycemia; per medic pt picked up from [employer]; pt stated he hasn't checked BG or had any insulin  in last 2 mo; meter said "high"; NS ; 20G IV left forearm; HR-105 ST    Pt placed on monitor

## 2023-10-08 NOTE — ED Notes (Signed)
 Assumed care of pt, pt resting in bed, eyes closed, equal rise and fall of chest, VSS.

## 2023-10-08 NOTE — Discharge Instructions (Signed)
 Come back if you get worse.  Follow-up without fail.

## 2023-10-09 LAB — EKG 12-LEAD
Atrial Rate: 85 {beats}/min
Diagnosis: NORMAL
P Axis: 86 degrees
P-R Interval: 172 ms
Q-T Interval: 392 ms
QRS Duration: 106 ms
QTc Calculation (Bazett): 466 ms
R Axis: -13 degrees
T Axis: 59 degrees
Ventricular Rate: 85 {beats}/min

## 2023-11-09 LAB — HEMOGLOBIN A1C: Hemoglobin A1C, External: >15.5 % — ABNORMAL HIGH (ref 4.8–5.6)

## 2024-03-26 ENCOUNTER — Emergency Department: Admit: 2024-03-27 | Payer: MEDICARE

## 2024-03-26 DIAGNOSIS — R739 Hyperglycemia, unspecified: Principal | ICD-10-CM

## 2024-03-26 DIAGNOSIS — E1165 Type 2 diabetes mellitus with hyperglycemia: Secondary | ICD-10-CM

## 2024-03-26 NOTE — ED Triage Notes (Signed)
"  Pt stated he has not taken his insuline in a long time and is not feeling well from that. Pt also stating he is urinating a lot.  "

## 2024-03-26 NOTE — ED Provider Notes (Signed)
 "EMERGENCY DEPARTMENT HISTORY AND PHYSICAL EXAM    Date: 03/26/2024  Patient Name: Donald Haney    History of Presenting Illness     Chief Complaint   Patient presents with    Hyperglycemia       History Provided By: Patient     History [name]   Donald Haney is a 68 y.o. male with a PMHX of type 2 diabetes, schizophrenia who presents to the emergency department  by POV. Patient states he last took insulin  3 months ago and is unsure what he normally takes.  Patient states yesterday he began pain within normal and feeling fatigue.  Patient denies nausea or vomiting.  Patient states he is normally seen at the [facility].  Patient endorses he is a type II diabetic.  Patient at this time denies shortness of breath, chest pain, syncope, blurred vision, nausea, vomiting.    PCP: No primary care provider on file.    No current facility-administered medications for this encounter.     Current Outpatient Medications   Medication Sig Dispense Refill    metFORMIN  (GLUCOPHAGE ) 1000 MG tablet Take 1 tablet by mouth 2 times daily (with meals) 60 tablet 0    Lancets MISC 1 each by Does not apply route daily 100 each 5    blood glucose monitor strips Test 3 times a day & as needed for symptoms of irregular blood glucose. Dispense sufficient amount for indicated testing frequency plus additional to accommodate PRN testing needs. 90 strip 1    metoprolol  succinate (TOPROL  XL) 25 MG extended release tablet Take 0.5 tablets by mouth every morning (before breakfast) 30 tablet 3    tamsulosin  (FLOMAX ) 0.4 MG capsule Take 1 capsule by mouth daily 30 capsule 3    Multiple Vitamins-Minerals (THERAPEUTIC MULTIVITAMIN-MINERALS) tablet Take 1 tablet by mouth daily 30 tablet 1    insulin  glargine (LANTUS ) 100 UNIT/ML injection vial Inject 30 Units into the skin nightly 3 mL 3    glucose monitoring (FREESTYLE FREEDOM) kit 1 kit by Does not apply route daily Can substitute for whichever glucometer is more affordable 1 kit 0    insulin  aspart  protamine-insulin  aspart (NOVOLOG  MIX 70/30) (70-30) 100 UNIT/ML injection Inject 15 Units into the skin 2 times daily (with meals) 3 mL 1       Past History     Past Medical History:  Past Medical History:   Diagnosis Date    Diabetes mellitus (HCC)     Schizo affective schizophrenia (HCC)        Past Surgical History:  No past surgical history on file.    Family History:  No family history on file.    Social History:  Social History     Tobacco Use    Smoking status: Former     Types: Cigarettes    Smokeless tobacco: Never   Substance Use Topics    Alcohol use: Not Currently    Drug use: Not Currently     Types: Cocaine, Opiates        Allergies:  No Known Allergies    Review of Systems   Review of Systems  All Other Systems Negative  Physical Exam     Vitals:    03/26/24 1938   BP: (!) 152/92   Pulse: 97   Resp: 17   Temp: 97.8 F (36.6 C)   TempSrc: Oral   SpO2: 99%   Weight: 77 kg (169 lb 11.2 oz)   Height: 1.778 m (5'  10)     Physical Exam  Vitals reviewed.   Constitutional:       General: He is not in acute distress.     Appearance: Normal appearance. He is not ill-appearing, toxic-appearing or diaphoretic.   HENT:      Head: Normocephalic and atraumatic.      Mouth/Throat:      Mouth: Mucous membranes are moist.      Pharynx: No oropharyngeal exudate or posterior oropharyngeal erythema.   Eyes:      General: No scleral icterus.        Right eye: No discharge.         Left eye: No discharge.      Extraocular Movements: Extraocular movements intact.      Conjunctiva/sclera: Conjunctivae normal.      Pupils: Pupils are equal, round, and reactive to light.   Cardiovascular:      Rate and Rhythm: Normal rate and regular rhythm.      Heart sounds: No murmur heard.  Pulmonary:      Effort: Pulmonary effort is normal. No respiratory distress.      Breath sounds: Normal breath sounds. No stridor. No wheezing or rhonchi.   Abdominal:      General: Abdomen is flat. Bowel sounds are normal. There is no distension.       Palpations: Abdomen is soft.      Tenderness: There is no abdominal tenderness.   Musculoskeletal:         General: No swelling, tenderness, deformity or signs of injury. Normal range of motion.      Cervical back: Normal range of motion and neck supple. No rigidity or tenderness.   Skin:     General: Skin is warm and dry.   Neurological:      General: No focal deficit present.      Mental Status: He is alert and oriented to person, place, and time. Mental status is at baseline.      Coordination: Coordination normal.      Comments: Gait is steady and patient exhibits no evidence of ataxia. Patient is able to ambulate without difficulty. No focal neurological deficit noted. No facial droop, slurred speech, or evidence of altered mentation noted on exam.     Psychiatric:         Mood and Affect: Mood normal.         Behavior: Behavior normal.         Thought Content: Thought content normal.       Diagnostic Study Results     Labs -     Recent Results (from the past 12 hours)   POCT Glucose    Collection Time: 03/26/24  7:36 PM   Result Value Ref Range    POC Glucose 369 (H) 70 - 110 mg/dL   CBC with Auto Differential    Collection Time: 03/26/24  7:45 PM   Result Value Ref Range    WBC 8.8 4.6 - 13.2 K/uL    RBC 4.23 (L) 4.35 - 5.65 M/uL    Hemoglobin 12.6 (L) 13.0 - 16.0 g/dL    Hematocrit 62.0 63.9 - 48.0 %    MCV 89.6 78.0 - 100.0 FL    MCH 29.8 24.0 - 34.0 PG    MCHC 33.2 31.0 - 37.0 g/dL    RDW 87.2 88.3 - 85.4 %    Platelets 243 135 - 420 K/uL    MPV 10.6 9.2 - 11.8 FL    Nucleated  RBCs 0.0 0 PER 100 WBC    nRBC 0.00 0.00 - 0.01 K/uL    Neutrophils % 62.6 40.0 - 73.0 %    Lymphocytes % 23.8 21.0 - 52.0 %    Monocytes % 9.1 3.0 - 10.0 %    Eosinophils % 4.0 0.0 - 5.0 %    Basophils % 0.2 0.0 - 2.0 %    Immature Granulocytes % 0.3 0.0 - 0.5 %    Neutrophils Absolute 5.53 1.80 - 8.00 K/UL    Lymphocytes Absolute 2.10 0.90 - 3.60 K/UL    Monocytes Absolute 0.80 0.05 - 1.20 K/UL    Eosinophils Absolute 0.35 0.00 -  0.40 K/UL    Basophils Absolute 0.02 0.00 - 0.10 K/UL    Immature Granulocytes Absolute 0.03 0.00 - 0.04 K/UL    Differential Type AUTOMATED     Comprehensive Metabolic Panel    Collection Time: 03/26/24  7:45 PM   Result Value Ref Range    Sodium 137 136 - 145 mmol/L    Potassium 4.3 3.5 - 5.5 mmol/L    Chloride 101 98 - 107 mmol/L    CO2 28 21 - 32 mmol/L    Anion Gap 7 3.0 - 18.0 mmol/L    Glucose 346 (H) 74 - 108 mg/dL    BUN 20 6 - 23 MG/DL    Creatinine 8.79 0.6 - 1.3 MG/DL    BUN/Creatinine Ratio 17 12 - 20      Est, Glom Filt Rate 65 >60 ml/min/1.68m2    Calcium 9.7 8.6 - 10.2 MG/DL    Total Bilirubin 0.4 0.2 - 1.0 MG/DL    ALT 31 10 - 50 U/L    AST 33 10 - 38 U/L    Alk Phosphatase 124 (H) 45 - 117 U/L    Total Protein 7.5 6.4 - 8.2 g/dL    Albumin 3.5 3.4 - 5.0 g/dL    Globulin 4.0 2.0 - 4.0 g/dL    Albumin/Globulin Ratio 0.9 0.8 - 1.7     Magnesium    Collection Time: 03/26/24  7:45 PM   Result Value Ref Range    Magnesium 1.7 1.6 - 2.6 mg/dL   Phosphorus    Collection Time: 03/26/24  7:45 PM   Result Value Ref Range    Phosphorus 3.4 2.5 - 4.9 MG/DL   Hemoglobin J8R    Collection Time: 03/26/24  7:45 PM   Result Value Ref Range    Hemoglobin A1C 9.8 (H) 4.2 - 5.6 %    Estimated Avg Glucose 235 mg/dL   pH, venous    Collection Time: 03/26/24  7:45 PM   Result Value Ref Range    pH, Ven 7.33 7.32 - 7.42     Venous Blood Gas, POC    Collection Time: 03/26/24  7:58 PM   Result Value Ref Range    PH, VENOUS (POC) 7.34 7.32 - 7.42      Specimen type: VENOUS BLOOD      Performed by: Denna Ned    POCT Glucose    Collection Time: 03/26/24  9:11 PM   Result Value Ref Range    POC Glucose 223 (H) 70 - 110 mg/dL       Radiologic Studies -   XR CHEST (2 VW)   Final Result      No acute pulmonary disease.      Electronically signed by Tinnie Bowen        @CT48 @  @CXR48 @  Procedures  Procedures    ED Course     8:23 PM EST I Signe Baptist am the first provider for this patient. Initial assessment performed. I  reviewed the vital signs, available nursing notes, past medical history, past surgical history, family history and social history. The patients presenting problems have been discussed, and they are in agreement with the care plan formulated and outlined with them.  I have encouraged them to ask questions as they arise throughout their visit.    Records Reviewed: Nursing Notes, Old Medical Records, Previous EKGs, and Previous Laboratory Studies    Is this patient to be included in the SEP-1 core measure? No Exclusion criteria - the patient is NOT to be included for SEP-1 Core Measure due to: Infection is not suspected    MEDICATIONS ADMINISTERED IN THE ED:  Medications   sodium chloride  0.9 % bolus 1,000 mL (0 mLs IntraVENous Stopped 03/26/24 2102)   insulin  regular (HumuLIN  R;NovoLIN  R) injection 10 Units (10 Units SubCUTAneous Given 03/26/24 2017)       MED RECONCILIATION:  No current facility-administered medications for this encounter.     Current Outpatient Medications   Medication Sig    metFORMIN  (GLUCOPHAGE ) 1000 MG tablet Take 1 tablet by mouth 2 times daily (with meals)    Lancets MISC 1 each by Does not apply route daily    blood glucose monitor strips Test 3 times a day & as needed for symptoms of irregular blood glucose. Dispense sufficient amount for indicated testing frequency plus additional to accommodate PRN testing needs.    metoprolol  succinate (TOPROL  XL) 25 MG extended release tablet Take 0.5 tablets by mouth every morning (before breakfast)    tamsulosin  (FLOMAX ) 0.4 MG capsule Take 1 capsule by mouth daily    Multiple Vitamins-Minerals (THERAPEUTIC MULTIVITAMIN-MINERALS) tablet Take 1 tablet by mouth daily    insulin  glargine (LANTUS ) 100 UNIT/ML injection vial Inject 30 Units into the skin nightly    glucose monitoring (FREESTYLE FREEDOM) kit 1 kit by Does not apply route daily Can substitute for whichever glucometer is more affordable    insulin  aspart protamine-insulin  aspart (NOVOLOG  MIX  70/30) (70-30) 100 UNIT/ML injection Inject 15 Units into the skin 2 times daily (with meals)       Medical Decision Making   Patient overall well-appearing ANO x 3.  Will obtain lab work and imaging for further evaluation of patients complaint. Will continue to monitor and evaluate patient while in the ED.    I reviewed the vital signs, available nursing notes, past medical history, past surgical history, family history and social history.    Vital Signs-Reviewed the patient's vital signs.    ED Course as of 03/26/24 2120   Tue Mar 26, 2024   2003 Initial patient pressure at this time.  Patient states he last took insulin  3 months ago and is unsure what he normally takes.  Patient states yesterday he began pain within normal and feeling fatigue.  Patient denies nausea or vomiting.  Patient states he is normally seen at the [facility].  Patient endorses he is a type II diabetic.  Patient at this time denies shortness of breath, chest pain, syncope, blurred vision, nausea, vomiting. [CH]   2003 Physical exam reassuring at this time. [CH]   2010 Venous pH 7.34.  Pending remainder of labs order 1 L saline bolus as well as 10 units regular insulin . [CH]   2019 CMP shows glucose 346 otherwise unremarkable. [CH]   2019 Magnesium and phosphorus grossly  within normal limits. [CH]   2021 X-ray chest per my interpretation shows no acute processes.  [CH]   2037 X-ray per radiological interpretation shows    IMPRESSION:     No acute pulmonary disease.   [CH]   2037 CBC shows no leukocytosis no significant anemia otherwise unremarkable. [CH]   2037 CMP not significant for anion gap bicarb grossly within normal limits potassium within normal limits otherwise grossly reassuring CMP. [CH]   2038 Patient has received 10 units regular insulin  and 1 L bag normal saline will recheck sugar 30 minutes. [CH]   2038 EKG per my interpretation shows underlying normal sinus rhythm 91 bpm no significant axis deviation no underlying ACS or STEMI [CH]    2047 Patient states currently he is unable to produce urine. [CH]   2051 At this time I discussion with patient about rather reassuring workup and that patient is not in DKA at its current state.  Patient does not know what type of insulin  he takes or how much he would take and I stated I would [facility].  Patient verbalizes comfort understanding with plan at this time.  At this time I will not refill his insulin  as the risk of patient using too much is substantial at this time.  [facility].  Strict ER return precautions given at this time with examples of what I want patient to immediately return to an emergency department to be seen for.  Patient verbalized, understanding.At this time patient stable for discharge vital signs stable upon discharge all patient questions answered thoroughly at this time patient agrees with plan moving forward strict ER return precautions given at this time which patient verbalizes competent understanding for.   [CH]   2113 POC glucose 223 at this time as stated before patient will be stable for discharge vital signs stable upon discharge strict ER return precautions given at this time patient strongly encouraged [facility] medication management which patient verbalizes comfort understanding for patient is to return to the emergency department with any new or worsening symptoms which patient also verbalizes understanding for.  Will refill patient's metformin  which she was prescribed to take 1000 twice a day.  Patient agrees with plan at this time all patient questions answered thoroughly. [CH]      ED Course User Index  [CH] Ina Freund, PA-C       Dictation disclaimer:  Please note that this dictation was completed with Dragon, the computer voice recognition software.  Quite often  unanticipated grammatical, syntax, homophones, and other interpretive errors are inadvertently transcribed by the computer software.  Please disregard these errors.  Please excuse any errors that have escaped final proofreading.      Diagnosis     CLINICAL IMPRESSION:  1. Hyperglycemia         Medication List        CONTINUE taking these medications      glucose monitoring kit  1 kit by Does not apply route daily Can substitute for whichever glucometer is more affordable     Lancets Misc  1 each by Does not apply route daily     metFORMIN  1000 MG tablet  Commonly known as: GLUCOPHAGE   Take 1 tablet by mouth 2 times daily (with meals)            ASK  your doctor about these medications      blood glucose test strips  Test 3 times a day & as needed for symptoms of irregular blood glucose. Dispense sufficient amount for indicated testing frequency plus additional to accommodate PRN testing needs.     insulin  aspart protamine-insulin  aspart (70-30) 100 UNIT/ML injection  Commonly known as: NovoLOG  Mix 70/30  Inject 15 Units into the skin 2 times daily (with meals)     insulin  glargine 100 UNIT/ML injection vial  Commonly known as: Lantus   Inject 30 Units into the skin nightly     metoprolol  succinate 25 MG extended release tablet  Commonly known as: TOPROL  XL  Take 0.5 tablets by mouth every morning (before breakfast)     tamsulosin  0.4 MG capsule  Commonly known as: FLOMAX   Take 1 capsule by mouth daily     therapeutic multivitamin-minerals tablet  Take 1 tablet by mouth daily               Where to Get Your Medications        These medications were sent to [facility] - [place], [place] - [address] - P [phone] [phone]  [address] [place] [address]      Phone: [phone]   metFORMIN  1000 MG tablet         Disposition: Discharge     Patient condition at time of disposition: Stable    DISCHARGE NOTE:   Pt has been reexamined. Patient has no new complaints, changes, or  physical findings.  Care plan outlined and precautions discussed.  Results were reviewed with the patient. All medications were reviewed with the patient. All of pt's questions and concerns were addressed.  Alarm symptoms and return precautions associated with chief complaint and evaluation were reviewed with the patient in detail.  The patient demonstrated adequate understanding.  Patient was instructed to follow up with PCP, as well as given strict return precautions to the ED upon further deterioration. Patient is ready for discharge home.    [id number] PA-C                Ina Freund, [place]  03/26/24 2120    "

## 2024-03-26 NOTE — Discharge Instructions (Signed)
"  Your evaluated in the emergency department today and are being discharged in stable condition.  Please return to the immediately to the ER if your symptoms worsen, you develop any new concerning symptoms, or you feel your condition is not improving.  Follow-up with your primary care provider as dictated for your ongoing care and monitoring.   "

## 2024-03-27 ENCOUNTER — Inpatient Hospital Stay
Admit: 2024-03-27 | Discharge: 2024-03-27 | Disposition: A | Discharge status: Home / Self Care | Payer: MEDICARE | Arrived: WI

## 2024-03-27 LAB — POCT GLUCOSE
POC Glucose: 369 mg/dL — ABNORMAL HIGH (ref 70–110)
POC Glucose: 223 mg/dL — ABNORMAL HIGH (ref 70–110)

## 2024-03-27 LAB — COMPREHENSIVE METABOLIC PANEL
ALT: 31 U/L (ref 10–50)
AST: 33 U/L (ref 10–38)
Albumin/Globulin Ratio: 0.9 (ref 0.8–1.7)
Albumin: 3.5 g/dL (ref 3.4–5.0)
Alk Phosphatase: 124 U/L — ABNORMAL HIGH (ref 45–117)
Anion Gap: 7 mmol/L (ref 3.0–18.0)
BUN/Creatinine Ratio: 17 (ref 12–20)
BUN: 20 mg/dL (ref 6–23)
CO2: 28 mmol/L (ref 21–32)
Calcium: 9.7 mg/dL (ref 8.6–10.2)
Chloride: 101 mmol/L (ref 98–107)
Creatinine: 1.2 mg/dL (ref 0.6–1.3)
Est, Glom Filt Rate: 65 ml/min/1.73m2 (ref >60)
Globulin: 4 g/dL (ref 2.0–4.0)
Glucose: 346 mg/dL — ABNORMAL HIGH (ref 74–108)
Potassium: 4.3 mmol/L (ref 3.5–5.5)
Sodium: 137 mmol/L (ref 136–145)
Total Bilirubin: 0.4 mg/dL (ref 0.2–1.0)
Total Protein: 7.5 g/dL (ref 6.4–8.2)

## 2024-03-27 LAB — PH, VENOUS: pH, Ven: 7.33 (ref 7.32–7.42)

## 2024-03-27 LAB — CBC WITH AUTO DIFFERENTIAL
Basophils %: 0.2 % (ref 0.0–2.0)
Basophils Absolute: 0.02 K/UL (ref 0.00–0.10)
Eosinophils %: 4 % (ref 0.0–5.0)
Eosinophils Absolute: 0.35 K/UL (ref 0.00–0.40)
Hematocrit: 37.9 % (ref 36.0–48.0)
Hemoglobin: 12.6 g/dL — ABNORMAL LOW (ref 13.0–16.0)
Immature Granulocytes %: 0.3 % (ref 0.0–0.5)
Immature Granulocytes Absolute: 0.03 K/UL (ref 0.00–0.04)
Lymphocytes %: 23.8 % (ref 21.0–52.0)
Lymphocytes Absolute: 2.1 K/UL (ref 0.90–3.60)
MCH: 29.8 pg (ref 24.0–34.0)
MCHC: 33.2 g/dL (ref 31.0–37.0)
MCV: 89.6 FL (ref 78.0–100.0)
MPV: 10.6 FL (ref 9.2–11.8)
Monocytes %: 9.1 % (ref 3.0–10.0)
Monocytes Absolute: 0.8 K/UL (ref 0.05–1.20)
Neutrophils %: 62.6 % (ref 40.0–73.0)
Neutrophils Absolute: 5.53 K/UL (ref 1.80–8.00)
Nucleated RBCs: 0 /100{WBCs}
Platelets: 243 K/uL (ref 135–420)
RBC: 4.23 M/uL — ABNORMAL LOW (ref 4.35–5.65)
RDW: 12.7 % (ref 11.6–14.5)
WBC: 8.8 K/uL (ref 4.6–13.2)
nRBC: 0 K/uL (ref 0.00–0.01)

## 2024-03-27 LAB — PHOSPHORUS: Phosphorus: 3.4 mg/dL (ref 2.5–4.9)

## 2024-03-27 LAB — HEMOGLOBIN A1C
Estimated Avg Glucose: 235 mg/dL
Hemoglobin A1C: 9.8 % — ABNORMAL HIGH (ref 4.2–5.6)

## 2024-03-27 LAB — VENOUS BLOOD GAS, POINT OF CARE: PH, VENOUS (POC): 7.34 (ref 7.32–7.42)

## 2024-03-27 LAB — MAGNESIUM: Magnesium: 1.7 mg/dL (ref 1.6–2.6)

## 2024-03-27 MED ORDER — METFORMIN HCL 1000 MG PO TABS
1000 | ORAL_TABLET | Freq: Two times a day (BID) | ORAL | 0 refills | 90.00000 days | Status: DC
Start: 2024-03-27 — End: 2024-06-13

## 2024-03-27 MED ORDER — INSULIN REGULAR HUMAN 100 UNIT/ML IJ SOLN
100 | Freq: Once | INTRAMUSCULAR | Status: AC
Start: 2024-03-27 — End: 2024-03-26
  Administered 2024-03-27: 01:00:00 10 [IU] via SUBCUTANEOUS

## 2024-03-27 MED ORDER — SODIUM CHLORIDE 0.9 % IV BOLUS
0.9 | Freq: Once | INTRAVENOUS | Status: AC
Start: 2024-03-27 — End: 2024-03-26
  Administered 2024-03-27: 01:00:00 1000 mL via INTRAVENOUS

## 2024-03-27 MED FILL — HUMULIN R 100 UNIT/ML IJ SOLN: 100 [IU]/mL | INTRAMUSCULAR | Qty: 10

## 2024-03-27 MED FILL — SODIUM CHLORIDE 0.9 % IV SOLN: 0.9 % | INTRAVENOUS | Qty: 1000

## 2024-03-28 LAB — EKG 12-LEAD
Atrial Rate: 91 {beats}/min
Diagnosis: NORMAL
P-R Interval: 164 ms
Q-T Interval: 380 ms
R Axis: 14 degrees

## 2024-06-13 ENCOUNTER — Inpatient Hospital Stay
Admit: 2024-06-13 | Discharge: 2024-06-14 | Disposition: A | Discharge status: Home / Self Care | Payer: MEDICARE | Arrived: WI | Attending: Emergency Medicine

## 2024-06-13 DIAGNOSIS — R739 Hyperglycemia, unspecified: Secondary | ICD-10-CM

## 2024-06-13 LAB — COMPREHENSIVE METABOLIC PANEL
ALT: 82 U/L — ABNORMAL HIGH (ref 10–50)
AST: 87 U/L — ABNORMAL HIGH (ref 10–38)
Albumin/Globulin Ratio: 0.8 (ref 0.8–1.7)
Albumin: 3.7 g/dL (ref 3.4–5.0)
Alk Phosphatase: 184 U/L — ABNORMAL HIGH (ref 45–117)
Anion Gap: 11 mmol/L (ref 3.0–18.0)
BUN/Creatinine Ratio: 18 (ref 12–20)
BUN: 24 mg/dL — ABNORMAL HIGH (ref 6–23)
CO2: 27 mmol/L (ref 21–32)
Calcium: 10.2 mg/dL (ref 8.6–10.2)
Chloride: 91 mmol/L — ABNORMAL LOW (ref 98–107)
Creatinine: 1.34 mg/dL — ABNORMAL HIGH (ref 0.6–1.3)
Est, Glom Filt Rate: 57 mL/min/{1.73_m2} — ABNORMAL LOW (ref >60)
Globulin: 4.5 g/dL — ABNORMAL HIGH (ref 2.0–4.0)
Glucose: 564 mg/dL (ref 74–108)
Potassium: 4.9 mmol/L (ref 3.5–5.5)
Sodium: 129 mmol/L — ABNORMAL LOW (ref 136–145)
Total Bilirubin: 0.6 mg/dL (ref 0.2–1.0)
Total Protein: 8.3 g/dL — ABNORMAL HIGH (ref 6.4–8.2)

## 2024-06-13 LAB — URINALYSIS
Bilirubin, Urine: NEGATIVE
Glucose, Ur: >1000 mg/dL — AB
Ketones, Urine: NEGATIVE mg/dL
Nitrite, Urine: NEGATIVE
Specific Gravity, UA: 1.027 (ref 1.005–1.030)
Urobilinogen, Urine: 0.2 EU/dL (ref 0.2–1.0)
pH, Urine: 6 (ref 5.0–8.0)

## 2024-06-13 LAB — URINE DRUG SCREEN
Amphetamine, Urine: NEGATIVE
Barbiturates, Urine: NEGATIVE
Benzodiazepines, Urine: NEGATIVE
Cocaine, Urine: NEGATIVE
Fentanyl: NEGATIVE
Methadone, Urine: NEGATIVE
Opiates, Urine: NEGATIVE
Oxycodone Urine: NEGATIVE
THC, TH-Cannabinol, Urine: NEGATIVE

## 2024-06-13 LAB — CBC WITH AUTO DIFFERENTIAL
Basophils %: 0.8 % (ref 0.0–2.0)
Basophils Absolute: 0.06 10*3/uL (ref 0.00–0.10)
Eosinophils %: 2.6 % (ref 0.0–5.0)
Eosinophils Absolute: 0.2 10*3/uL (ref 0.00–0.40)
Hematocrit: 40.1 % (ref 36.0–48.0)
Hemoglobin: 13.2 g/dL (ref 13.0–16.0)
Immature Granulocytes %: 0.3 % (ref 0.0–0.5)
Immature Granulocytes Absolute: 0.02 10*3/uL (ref 0.00–0.04)
Lymphocytes %: 23.2 % (ref 21.0–52.0)
Lymphocytes Absolute: 1.79 10*3/uL (ref 0.90–3.60)
MCH: 28.4 pg (ref 24.0–34.0)
MCHC: 32.9 g/dL (ref 31.0–37.0)
MCV: 86.2 FL (ref 78.0–100.0)
MPV: 11.6 FL (ref 9.2–11.8)
Monocytes %: 12.2 % — ABNORMAL HIGH (ref 3.0–10.0)
Monocytes Absolute: 0.94 10*3/uL (ref 0.05–1.20)
Neutrophils %: 60.9 % (ref 40.0–73.0)
Neutrophils Absolute: 4.69 10*3/uL (ref 1.80–8.00)
Nucleated RBCs: 0 /100{WBCs}
Platelets: 258 10*3/uL (ref 135–420)
RBC: 4.65 M/uL (ref 4.35–5.65)
RDW: 13 % (ref 11.6–14.5)
WBC: 7.7 10*3/uL (ref 4.6–13.2)
nRBC: 0 10*3/uL (ref 0.00–0.01)

## 2024-06-13 LAB — MAGNESIUM: Magnesium: 1.8 mg/dL (ref 1.6–2.6)

## 2024-06-13 LAB — URINALYSIS, MICRO

## 2024-06-13 LAB — PH, VENOUS: pH, Ven: 7.33 (ref 7.32–7.42)

## 2024-06-13 LAB — POCT GLUCOSE
POC Glucose: 542 mg/dL (ref 70–110)
POC Glucose: 548 mg/dL (ref 70–110)

## 2024-06-13 MED ORDER — METFORMIN HCL 1000 MG PO TABS
1000 | ORAL_TABLET | Freq: Two times a day (BID) | ORAL | 0 refills | Status: AC
Start: 2024-06-13 — End: ?

## 2024-06-13 MED ORDER — CEPHALEXIN 250 MG PO CAPS
250 | ORAL | Status: AC
Start: 2024-06-13 — End: 2024-06-13
  Administered 2024-06-14: 500 mg via ORAL

## 2024-06-13 MED ORDER — SODIUM CHLORIDE 0.9 % IV BOLUS
0.9 | Freq: Once | INTRAVENOUS | Status: AC
Start: 2024-06-13 — End: 2024-06-13
  Administered 2024-06-13: 23:00:00 1000 mL via INTRAVENOUS

## 2024-06-13 MED ORDER — METRONIDAZOLE 500 MG PO TABS
500 | ORAL | Status: AC
Start: 2024-06-13 — End: 2024-06-13
  Administered 2024-06-14: 500 mg via ORAL

## 2024-06-13 MED FILL — SODIUM CHLORIDE 0.9 % IV SOLN: 0.9 % | INTRAVENOUS | Qty: 1000

## 2024-06-13 NOTE — ED Notes (Signed)
 Telepsych placed in room. Pt was sleeping on floor prior to being woken up.

## 2024-06-13 NOTE — Virtual Health (Signed)
 Donald Haney, was evaluated through a synchronous (real-time) audio-video encounter. The patient (and/or guardian if applicable) is aware that this is a billable service, which includes applicable co-pays. This virtual visit was conducted with patient's (and/or legal guardian's) consent. Patient identification was verified, and a caregiver was present when appropriate.  The patient was located at Facility (Appt Department): [facility]  [facility] EMERGENCY DEPARTMENT  [address]  [place] [address]  Loc: [phone]  The provider was located at Home (City/State): [place]   Confirm you are appropriately licensed, registered, or certified to deliver care in the state where the patient is located as indicated above. If you are not or unsure, please re-schedule the visit: Yes, I confirm.   [facility] Consult to [employer]  Consult performed by: Derl Maha, APRN - CNP  Consult ordered by: Ina Freund, PA-C  Reason for consult: Psychiatric Evaluation      Donald Haney  [birth year]     EMERGENCY DEPARTMENT TELEPSYCHIATRY EVALUATION    06/13/24    Chief Complaint:  I can't trust nobody  HPI: Patient is a 69 y.o. male who presents for psychiatric evaluation. Patient presented to the ED on 06/13/24 from street. Per ED Patient is here due to not having any insulin  for a couple of months. He is homeless at this time. He also needs his shot for schizophrenia.     Patient reports presenting to the ED due to diabetes, UTI and he needs his Prolixin shot for his schizophrenia. Has not had it in 2 months.   Reports he has been this way since 1980. Denies current auditory or visual hallucinations. Reports he will hear voices at times when he is walking down the street the dope man, its in my head.   States he can't trust anyone, feels the devil is out to get him. Worried about harming someone due to the paranoia the dope dealer. He does feels people want to poison him at times  but not currently affecting his appetite.  Sleep is okay but due to homelessness he does not sleep well at times. Appetite is pretty good. Denies SI/HI. Denies history of suicide attempts.   [facility], Reports last shot was 2 months ago, not seen on dispense report or in chart. HE denies currently having therapist or case management.  Patient also homeless.   He does endorse smoking cigarettes. States he has been trying to stop using crack cocaine because he can't afford it. I need the money to eat because I'm homeless. Denies recent ETOH use.         Past Psychiatric History:  Previous Diagnoses/symptoms: Cocaine dependency, schizophrenia, adjustment disorder, alcohol abuse, paranoid schizophrenia- per chart review  Previous suicide attempts/self-harm: Denies  Inpatient psychiatric hospitalizations: yes  [facility]  Current therapist: States not in therapy  Previous psychiatric medication trials: Prolixin, Haldol, Thorazine and Cogentin  Current psychiatric medications: Prolixin 12.mg - last had it 2 months ago  History of ECT: no  Family Psychiatric History: Denies    Substance Abuse History:  Tobacco: Endorses 1/2 ppd  Alcohol: Endorses sometimes  Marijuana: Endorses occasional  Stimulant: Endorses crack coaine- none recent UDS negative  Opiates: Denies  Benzodiazepine: Denies  Other illicit drug usage: Denies  History of substance/alcohol abuse treatment: Denies    Social History:  Education: Some college  Living Situation/Interest: homeless  Marital/Committed relationship and parenting hx: divorced  Occupation: Unemployed  Legal History/Hx of Violence: Denies  Spiritual History: unknown  Psychological trauma, neglect, or abuse: denies hx of trauma/abuse   Access to guns or other weapons: denies having access to firearms/dangerous weapons     Past Medical History:  Active Ambulatory Problems     Diagnosis Date Noted     Rhabdomyolysis 10/31/2021     Resolved Ambulatory Problems     Diagnosis Date Noted    No Resolved Ambulatory Problems     Past Medical History:   Diagnosis Date    Diabetes mellitus (HCC)     Schizo affective schizophrenia (HCC)        Past Surgical History:    No past surgical history on file.   Allergies:  No Known Allergies   Medications:  No current facility-administered medications for this encounter.    Current Outpatient Medications:     metFORMIN  (GLUCOPHAGE ) 1000 MG tablet, Take 1 tablet by mouth 2 times daily (with meals), Disp: 60 tablet, Rfl: 0    Lancets MISC, 1 each by Does not apply route daily, Disp: 100 each, Rfl: 5    blood glucose monitor strips, Test 3 times a day & as needed for symptoms of irregular blood glucose. Dispense sufficient amount for indicated testing frequency plus additional to accommodate PRN testing needs., Disp: 90 strip, Rfl: 1    metoprolol  succinate (TOPROL  XL) 25 MG extended release tablet, Take 0.5 tablets by mouth every morning (before breakfast), Disp: 30 tablet, Rfl: 3    tamsulosin  (FLOMAX ) 0.4 MG capsule, Take 1 capsule by mouth daily, Disp: 30 capsule, Rfl: 3    Multiple Vitamins-Minerals (THERAPEUTIC MULTIVITAMIN-MINERALS) tablet, Take 1 tablet by mouth daily, Disp: 30 tablet, Rfl: 1    insulin  glargine (LANTUS ) 100 UNIT/ML injection vial, Inject 30 Units into the skin nightly, Disp: 3 mL, Rfl: 3    glucose monitoring (FREESTYLE FREEDOM) kit, 1 kit by Does not apply route daily Can substitute for whichever glucometer is more affordable, Disp: 1 kit, Rfl: 0    insulin  aspart protamine-insulin  aspart (NOVOLOG  MIX 70/30) (70-30) 100 UNIT/ML injection, Inject 15 Units into the skin 2 times daily (with meals), Disp: 3 mL, Rfl: 1  Not in a hospital admission.  Prior to Admission medications   Medication Sig Start Date End Date Taking? Authorizing Provider   metFORMIN  (GLUCOPHAGE ) 1000 MG tablet Take 1 tablet by mouth 2 times daily (with meals) 06/13/24  Yes Ina Freund,  PA-C   Lancets MISC 1 each by Does not apply route daily 11/05/21   Darci Donnice CROME, DO   blood glucose monitor strips Test 3 times a day & as needed for symptoms of irregular blood glucose. Dispense sufficient amount for indicated testing frequency plus additional to accommodate PRN testing needs. 11/05/21   Darci Donnice CROME, DO   metoprolol  succinate (TOPROL  XL) 25 MG extended release tablet Take 0.5 tablets by mouth every morning (before breakfast) 11/06/21   Darci Donnice CROME, DO   tamsulosin  (FLOMAX ) 0.4 MG capsule Take 1 capsule by mouth daily 11/06/21   Darci Donnice CROME, DO   Multiple Vitamins-Minerals (THERAPEUTIC MULTIVITAMIN-MINERALS) tablet Take 1 tablet by mouth daily 11/06/21   Darci Donnice CROME, DO   insulin  glargine (LANTUS ) 100 UNIT/ML injection vial Inject 30 Units into the skin nightly 11/05/21   Darci Donnice CROME, DO   glucose monitoring (FREESTYLE FREEDOM) kit 1 kit by Does not apply route daily Can substitute for whichever glucometer is more affordable 11/05/21   Darci Donnice CROME, DO  insulin  aspart protamine-insulin  aspart (NOVOLOG  MIX 70/30) (70-30) 100 UNIT/ML injection Inject 15 Units into the skin 2 times daily (with meals) 11/05/21   Darci Donnice CROME, DO      Problem List:   Active Problems:    * No active hospital problems. *  Resolved Problems:    * No resolved hospital problems. *       OBJECTIVE  Vital Signs:  Vitals:    06/13/24 2251   BP: (!) 89/58   Pulse: (!) 108   Resp: 16   Temp: 97.3 F (36.3 C)   SpO2: 99%     Labs:  Reviewed. UDS: negative  ETOH: negative    Recent Results (from the past 48 hours)   POCT Glucose    Collection Time: 06/13/24  5:46 PM   Result Value Ref Range    POC Glucose 548 (HH) 70 - 110 mg/dL   POCT Glucose    Collection Time: 06/13/24  5:47 PM   Result Value Ref Range    POC Glucose 542 (HH) 70 - 110 mg/dL   CBC with Auto Differential    Collection Time: 06/13/24  6:00 PM   Result Value Ref Range    WBC 7.7 4.6 - 13.2 K/uL    RBC 4.65 4.35 - 5.65 M/uL    Hemoglobin 13.2  13.0 - 16.0 g/dL    Hematocrit 59.8 63.9 - 48.0 %    MCV 86.2 78.0 - 100.0 FL    MCH 28.4 24.0 - 34.0 PG    MCHC 32.9 31.0 - 37.0 g/dL    RDW 86.9 88.3 - 85.4 %    Platelets 258 135 - 420 K/uL    MPV 11.6 9.2 - 11.8 FL    Nucleated RBCs 0.0 0 PER 100 WBC    nRBC 0.00 0.00 - 0.01 K/uL    Neutrophils % 60.9 40.0 - 73.0 %    Lymphocytes % 23.2 21.0 - 52.0 %    Monocytes % 12.2 (H) 3.0 - 10.0 %    Eosinophils % 2.6 0.0 - 5.0 %    Basophils % 0.8 0.0 - 2.0 %    Immature Granulocytes % 0.3 0.0 - 0.5 %    Neutrophils Absolute 4.69 1.80 - 8.00 K/UL    Lymphocytes Absolute 1.79 0.90 - 3.60 K/UL    Monocytes Absolute 0.94 0.05 - 1.20 K/UL    Eosinophils Absolute 0.20 0.00 - 0.40 K/UL    Basophils Absolute 0.06 0.00 - 0.10 K/UL    Immature Granulocytes Absolute 0.02 0.00 - 0.04 K/UL    Differential Type AUTOMATED     Comprehensive Metabolic Panel    Collection Time: 06/13/24  6:00 PM   Result Value Ref Range    Sodium 129 (L) 136 - 145 mmol/L    Potassium 4.9 3.5 - 5.5 mmol/L    Chloride 91 (L) 98 - 107 mmol/L    CO2 27 21 - 32 mmol/L    Anion Gap 11 3.0 - 18.0 mmol/L    Glucose 564 (HH) 74 - 108 mg/dL    BUN 24 (H) 6 - 23 MG/DL    Creatinine 8.65 (H) 0.6 - 1.3 MG/DL    BUN/Creatinine Ratio 18 12 - 20      Est, Glom Filt Rate 57 (L) >60 ml/min/1.86m2    Calcium 10.2 8.6 - 10.2 MG/DL    Total Bilirubin 0.6 0.2 - 1.0 MG/DL    ALT 82 (H) 10 - 50 U/L    AST  87 (H) 10 - 38 U/L    Alk Phosphatase 184 (H) 45 - 117 U/L    Total Protein 8.3 (H) 6.4 - 8.2 g/dL    Albumin 3.7 3.4 - 5.0 g/dL    Globulin 4.5 (H) 2.0 - 4.0 g/dL    Albumin/Globulin Ratio 0.8 0.8 - 1.7     pH, venous    Collection Time: 06/13/24  6:00 PM   Result Value Ref Range    pH, Ven 7.33 7.32 - 7.42     Urinalysis    Collection Time: 06/13/24  6:00 PM   Result Value Ref Range    Color, UA YELLOW      Appearance CLEAR      Specific Gravity, UA 1.027 1.005 - 1.030      pH, Urine 6.0 5.0 - 8.0      Protein, UA TRACE (A) NEG mg/dL    Glucose, Ur >8999 (A) NEG mg/dL     Ketones, Urine Negative NEG mg/dL    Bilirubin, Urine Negative NEG      Blood, Urine MODERATE (A) NEG      Urobilinogen, Urine 0.2 0.2 - 1.0 EU/dL    Nitrite, Urine Negative NEG      Leukocyte Esterase, Urine MODERATE (A) NEG     Magnesium    Collection Time: 06/13/24  6:00 PM   Result Value Ref Range    Magnesium 1.8 1.6 - 2.6 mg/dL   Ethanol    Collection Time: 06/13/24  6:00 PM   Result Value Ref Range    Ethanol Lvl <11 MG/DL   Urine Drug Screen    Collection Time: 06/13/24  6:00 PM   Result Value Ref Range    Amphetamine, Urine Negative NEG      Barbiturates, Urine Negative NEG      Benzodiazepines, Urine Negative NEG      Cocaine, Urine Negative NEG      Fentanyl Negative NEG      Methadone, Urine Negative NEG      Opiates, Urine Negative NEG      Oxycodone Urine Negative NEG      THC, TH-Cannabinol, Urine Negative NEG      Drug Screen Comment:        Specimen analysis was performed without chain of custody handling. These results should be used for medical purposes only and not for legal or employment purposes. Unconfirmed screening results must not be used for non-medical purposes.   Urinalysis, Micro    Collection Time: 06/13/24  6:00 PM   Result Value Ref Range    WBC, UA 21-35 0 - 5 /hpf    RBC, UA 4-10 0 - 5 /hpf    Epithelial Cells, UA FEW 0 - 5 /lpf    BACTERIA, URINE FEW (A) NEG /hpf    Yeast, UA 1+ (A) NEG    Trichomonas, Urine FEW (A) NEG     POCT Glucose    Collection Time: 06/13/24 10:48 PM   Result Value Ref Range    POC Glucose 150 (H) 70 - 110 mg/dL       Imaging:   No orders to display     Radiology:  No results found.    Review of Systems:  Reports no current cardiovascular, respiratory, gastrointestinal, genitourinary, integumentary, neurological, musculoskeletal, or immunological symptoms today.   PSYCHIATRIC: See HPI above.    PSYCHIATRIC EXAMINATION / MENTAL STATUS EXAM    Level of consciousness:  within normal limits and awake   Appearance:  ill-appearing, street clothes, in chair, and  poor grooming.  Does appear stated age. No acute distress.  Behavior/Motor: no psychomotor agitation, retardation, or abnormal movements noted  Attitude toward examiner:  cooperative and attentive  SI/HI:Denies SI/HI  Sleep: Decreased  Speech:  normal rate and normal volume ,  normal tone, garbled at times   Mood: within normal limits  Affect:  euthymic, full range, non-labile  Thought Processes:  goal directed.  No tangentiality or circumstantiality. No flight of ideas or loosening of associations.  Thought Content:  Delusions:  paranoid  Perceptual Disturbance:  auditory. No delusions or other perceptual abnormalities.  Hallucinations: +Auditory Hallucinations  Cognition:  oriented to person, place, and time   Concentration: distractible  Memory: intact, though not formally tested.  Insight: poor   Judgement: poor   Fund of Knowledge: limited      Risk Assessment:  C-SSRS Score       06/13/2024     5:59 PM   C-SSRS Suicide Screening   1) Within the past month, have you wished you were dead or wished you could go to sleep and not wake up?  No   2) Have you actually had any thoughts of killing yourself?  No   6) Have you ever done anything, started to do anything, or prepared to do anything to end your life? No    :    Protective Factors:  Protective: Denies depression, Denies suicidal ideation, Does not have lethal plan, Does not have access to guns, and No prior suicide attempts  Risk Factors:  Risk Factors: Age <25 or >66, Male gender, Active psychosis, and housing concerns      Overall Level Suicide Risk: TelePsych CSSRS Risk Level: Low Risk    Assessment:   Patient is a 69 y.o. African American male who presents for psychiatric evaluation. Patient is calm and cooperative. HE is alert and oriented x3. He denies SI/HI. De does indorse paranoia and thoughts to protect himself due to paranoia. He denies current AH/VH however endorses history of AH that affects him. Currently homeless, has poor sleep at times, good  appetite (although affected in the past by thoughts that others want to poison him). [facility] with [facility], states he has not had medication in 2 months, not clear if this is acurate. Has a history of non compliance. He is willing to seek voluntary psychiatric admission for stabilization and medication management. Telepsychiatry will recommend admission at this time.     Dx:   Schizophrenia    Plan:  Inpatient psychiatric admission at appropriate care level facility, once medically cleared and stable and The patient is agreeable to voluntary psychiatric admission.  Legal Status: VOLUNTARY.  Patient is endorsing paranoia.  Medical co-morbidities: Management per medical providers, appreciate assistance.  Reviewed treatment plan with patient including risks, benefits, alternatives, and side effects of medications, and any/all black box warnings. Patient verbalized understanding.  Patient had an opportunity to ask questions and address concerns. Obtained informed consent for treatment.  Medical records, labs, and diagnostic tests reviewed.   Re-consult for any new changes or concerns. Thank you for this consult.  Discussed recommendations with Signe Baptist PA-C at time of consult completion.    TelePsych recommendations:Inpatient psychiatric admission    Legal hold: No Involuntary Hold    Telepsychiatry will sign off. Thank you for allowing us  to participate in the care of this patient. Please send message or call via [employer] if anything more is required.  Electronically signed by Donald Oman, APRN - CNP on 06/13/2024 at 11:33 PM.    END OF NOTE  -------------------------                       Total time spent on this encounter: Not billed by time    --Donald Balis-Garrett, APRN - CNP on 06/13/2024 at 11:32 PM    An electronic signature was used to authenticate this note.

## 2024-06-13 NOTE — ED Notes (Signed)
 Pt lying on the floor in room, states I need a bed to sleep in, attempted to return pt to recliner, and he refused

## 2024-06-13 NOTE — ED Triage Notes (Signed)
 Patient is here due to not having any insulin  for a couple of months. He is homeless at this time. He also needs his shot for schizophrenia.

## 2024-06-13 NOTE — ED Notes (Signed)
 Pt laying on floor in FT room; Pt demanding a bed instead of a recliner, stating he will NOT get up off the floor

## 2024-06-13 NOTE — ED Provider Notes (Signed)
 EMERGENCY DEPARTMENT HISTORY AND PHYSICAL EXAM    Date: 06/13/2024  Patient Name: Donald Haney    History of Presenting Illness     Chief Complaint   Patient presents with    Hyperglycemia       History Provided By: Patient     History Donald):   DADEN Haney is a 69 y.o. male with a PMHX of diabetes schizophrenia who presents to the emergency department  by POV.  Patient states he has not had his insulin  for couple months he is [facility] also says he needs his fluphenazine shot for his schizophrenia.  Patient denies nausea vomiting chest pain shortness of breath dizziness blurred vision syncope.  Patient also states he would like to speak with mental health.  Patient denies SI HI at time of my assessment and just states he has a lot going on.    PCP: No primary care provider on file.    No current facility-administered medications for this encounter.     Current Outpatient Medications   Medication Sig Dispense Refill    metFORMIN  (GLUCOPHAGE ) 1000 MG tablet Take 1 tablet by mouth 2 times daily (with meals) 60 tablet 0    Lancets MISC 1 each by Does not apply route daily 100 each 5    blood glucose monitor strips Test 3 times a day & as needed for symptoms of irregular blood glucose. Dispense sufficient amount for indicated testing frequency plus additional to accommodate PRN testing needs. 90 strip 1    metoprolol  succinate (TOPROL  XL) 25 MG extended release tablet Take 0.5 tablets by mouth every morning (before breakfast) 30 tablet 3    tamsulosin  (FLOMAX ) 0.4 MG capsule Take 1 capsule by mouth daily 30 capsule 3    Multiple Vitamins-Minerals (THERAPEUTIC MULTIVITAMIN-MINERALS) tablet Take 1 tablet by mouth daily 30 tablet 1    insulin  glargine (LANTUS ) 100 UNIT/ML injection vial Inject 30 Units into the skin nightly 3 mL 3    glucose monitoring (FREESTYLE FREEDOM) kit 1 kit by Does not apply route daily Can substitute for whichever glucometer is more affordable 1  kit 0    insulin  aspart protamine-insulin  aspart (NOVOLOG  MIX 70/30) (70-30) 100 UNIT/ML injection Inject 15 Units into the skin 2 times daily (with meals) 3 mL 1       Past History     Past Medical History:  Past Medical History:   Diagnosis Date    Diabetes mellitus (HCC)     Schizo affective schizophrenia (HCC)        Past Surgical History:  No past surgical history on file.    Family History:  No family history on file.    Social History:  Social History     Tobacco Use    Smoking status: Former     Types: Cigarettes    Smokeless tobacco: Never   Substance Use Topics    Alcohol use: Not Currently    Drug use: Not Currently     Types: Cocaine, Opiates        Allergies:  No Known Allergies    Review of Systems   Review of Systems  All Other Systems Negative  Physical Exam     Vitals:    06/13/24 1745 06/13/24 2251 06/14/24 0000   BP: (!) 138/95 (!) 89/58 114/79   Pulse: (!) 104 (!) 108 96   Resp: 16 16    Temp: 97.6 F (36.4 C) 97.3 F (36.3 C)  TempSrc: Oral Oral    SpO2: 99% 99%    Weight: 74.4 kg (164 lb)     Height: 1.524 m (5')       Physical Exam  Vitals reviewed.   Constitutional:       General: He is not in acute distress.     Appearance: Normal appearance. He is not ill-appearing, toxic-appearing or diaphoretic.   HENT:      Head: Normocephalic and atraumatic.      Mouth/Throat:      Mouth: Mucous membranes are moist.      Comments: Uvula is midline, tolerating oral secretions, no change in phonation, no trismus, no induration or area of fluctuance, no tonsillar swelling airway is patent    Eyes:      General: No scleral icterus.        Right eye: No discharge.         Left eye: No discharge.      Extraocular Movements: Extraocular movements intact.      Conjunctiva/sclera: Conjunctivae normal.      Pupils: Pupils are equal, round, and reactive to light.   Cardiovascular:      Rate and Rhythm: Normal rate and regular rhythm.      Heart sounds: No murmur heard.  Pulmonary:      Effort: Pulmonary effort  is normal. No respiratory distress.      Breath sounds: Normal breath sounds. No stridor. No wheezing or rhonchi.   Abdominal:      General: Abdomen is flat. Bowel sounds are normal. There is no distension.      Palpations: Abdomen is soft.      Tenderness: There is no abdominal tenderness.   Musculoskeletal:         General: No swelling, tenderness, deformity or signs of injury. Normal range of motion.      Cervical back: Normal range of motion and neck supple. No rigidity.   Skin:     Capillary Refill: Capillary refill takes less than 2 seconds.   Neurological:      General: No focal deficit present.      Mental Status: He is alert and oriented to person, place, and time. Mental status is at baseline.      Coordination: Coordination normal.      Comments: Gait is steady and patient exhibits no evidence of ataxia. Patient is able to ambulate without difficulty. No focal neurological deficit noted. No facial droop, slurred speech, or evidence of altered mentation noted on exam.     Psychiatric:         Mood and Affect: Mood normal.       Diagnostic Study Results     Labs -     Recent Results (from the past 12 hours)   POCT Glucose    Collection Time: 06/13/24  5:46 PM   Result Value Ref Range    POC Glucose 548 (HH) 70 - 110 mg/dL   POCT Glucose    Collection Time: 06/13/24  5:47 PM   Result Value Ref Range    POC Glucose 542 (HH) 70 - 110 mg/dL   CBC with Auto Differential    Collection Time: 06/13/24  6:00 PM   Result Value Ref Range    WBC 7.7 4.6 - 13.2 K/uL    RBC 4.65 4.35 - 5.65 M/uL    Hemoglobin 13.2 13.0 - 16.0 g/dL    Hematocrit 59.8 63.9 - 48.0 %    MCV 86.2 78.0 - 100.0 FL  MCH 28.4 24.0 - 34.0 PG    MCHC 32.9 31.0 - 37.0 g/dL    RDW 86.9 88.3 - 85.4 %    Platelets 258 135 - 420 K/uL    MPV 11.6 9.2 - 11.8 FL    Nucleated RBCs 0.0 0 PER 100 WBC    nRBC 0.00 0.00 - 0.01 K/uL    Neutrophils % 60.9 40.0 - 73.0 %    Lymphocytes % 23.2 21.0 - 52.0 %    Monocytes % 12.2 (H) 3.0 - 10.0 %    Eosinophils % 2.6  0.0 - 5.0 %    Basophils % 0.8 0.0 - 2.0 %    Immature Granulocytes % 0.3 0.0 - 0.5 %    Neutrophils Absolute 4.69 1.80 - 8.00 K/UL    Lymphocytes Absolute 1.79 0.90 - 3.60 K/UL    Monocytes Absolute 0.94 0.05 - 1.20 K/UL    Eosinophils Absolute 0.20 0.00 - 0.40 K/UL    Basophils Absolute 0.06 0.00 - 0.10 K/UL    Immature Granulocytes Absolute 0.02 0.00 - 0.04 K/UL    Differential Type AUTOMATED     Comprehensive Metabolic Panel    Collection Time: 06/13/24  6:00 PM   Result Value Ref Range    Sodium 129 (L) 136 - 145 mmol/L    Potassium 4.9 3.5 - 5.5 mmol/L    Chloride 91 (L) 98 - 107 mmol/L    CO2 27 21 - 32 mmol/L    Anion Gap 11 3.0 - 18.0 mmol/L    Glucose 564 (HH) 74 - 108 mg/dL    BUN 24 (H) 6 - 23 MG/DL    Creatinine 8.65 (H) 0.6 - 1.3 MG/DL    BUN/Creatinine Ratio 18 12 - 20      Est, Glom Filt Rate 57 (L) >60 ml/min/1.48m2    Calcium 10.2 8.6 - 10.2 MG/DL    Total Bilirubin 0.6 0.2 - 1.0 MG/DL    ALT 82 (H) 10 - 50 U/L    AST 87 (H) 10 - 38 U/L    Alk Phosphatase 184 (H) 45 - 117 U/L    Total Protein 8.3 (H) 6.4 - 8.2 g/dL    Albumin 3.7 3.4 - 5.0 g/dL    Globulin 4.5 (H) 2.0 - 4.0 g/dL    Albumin/Globulin Ratio 0.8 0.8 - 1.7     pH, venous    Collection Time: 06/13/24  6:00 PM   Result Value Ref Range    pH, Ven 7.33 7.32 - 7.42     Urinalysis    Collection Time: 06/13/24  6:00 PM   Result Value Ref Range    Color, UA YELLOW      Appearance CLEAR      Specific Gravity, UA 1.027 1.005 - 1.030      pH, Urine 6.0 5.0 - 8.0      Protein, UA TRACE (A) NEG mg/dL    Glucose, Ur >8999 (A) NEG mg/dL    Ketones, Urine Negative NEG mg/dL    Bilirubin, Urine Negative NEG      Blood, Urine MODERATE (A) NEG      Urobilinogen, Urine 0.2 0.2 - 1.0 EU/dL    Nitrite, Urine Negative NEG      Leukocyte Esterase, Urine MODERATE (A) NEG     Magnesium    Collection Time: 06/13/24  6:00 PM   Result Value Ref Range    Magnesium 1.8 1.6 - 2.6 mg/dL   Ethanol    Collection Time:  06/13/24  6:00 PM   Result Value Ref Range    Ethanol  Lvl <11 MG/DL   Urine Drug Screen    Collection Time: 06/13/24  6:00 PM   Result Value Ref Range    Amphetamine, Urine Negative NEG      Barbiturates, Urine Negative NEG      Benzodiazepines, Urine Negative NEG      Cocaine, Urine Negative NEG      Fentanyl Negative NEG      Methadone, Urine Negative NEG      Opiates, Urine Negative NEG      Oxycodone Urine Negative NEG      THC, TH-Cannabinol, Urine Negative NEG      Drug Screen Comment:        Specimen analysis was performed without chain of custody handling. These results should be used for medical purposes only and not for legal or employment purposes. Unconfirmed screening results must not be used for non-medical purposes.   Urinalysis, Micro    Collection Time: 06/13/24  6:00 PM   Result Value Ref Range    WBC, UA 21-35 0 - 5 /hpf    RBC, UA 4-10 0 - 5 /hpf    Epithelial Cells, UA FEW 0 - 5 /lpf    BACTERIA, URINE FEW (A) NEG /hpf    Yeast, UA 1+ (A) NEG    Trichomonas, Urine FEW (A) NEG     POCT Glucose    Collection Time: 06/13/24 10:48 PM   Result Value Ref Range    POC Glucose 150 (H) 70 - 110 mg/dL       Radiologic Studies -   No orders to display     @CT48 @  @CXR48 @  Procedures     Procedures    ED Course     12:18 AM EST I Signe Baptist am the first provider for this patient. Initial assessment performed. I reviewed the vital signs, available nursing notes, past medical history, past surgical history, family history and social history. The patients presenting problems have been discussed, and they are in agreement with the care plan formulated and outlined with them.  I have encouraged them to ask questions as they arise throughout their visit.    Records Reviewed: Nursing Notes, Old Medical Records, Previous EKGs, Previous Radiology Studies, and Previous Laboratory Studies    Is this patient to be included in the SEP-1 core measure? No Exclusion criteria - the patient is NOT to be included for SEP-1 Core Measure due to: Infection is not  suspected    MEDICATIONS ADMINISTERED IN THE ED:  Medications   sodium chloride  0.9 % bolus 1,000 mL (0 mLs IntraVENous Stopped 06/13/24 1926)   cephALEXin  (KEFLEX ) capsule 500 mg (500 mg Oral Given 06/13/24 1921)   metroNIDAZOLE  (FLAGYL ) tablet 500 mg (500 mg Oral Given 06/13/24 1921)   insulin  regular (HumuLIN  R;NovoLIN  R) injection 10 Units (10 Units SubCUTAneous Given 06/13/24 1925)       MED RECONCILIATION:  No current facility-administered medications for this encounter.     Current Outpatient Medications   Medication Sig    metFORMIN  (GLUCOPHAGE ) 1000 MG tablet Take 1 tablet by mouth 2 times daily (with meals)    Lancets MISC 1 each by Does not apply route daily    blood glucose monitor strips Test 3 times a day & as needed for symptoms of irregular blood glucose. Dispense sufficient amount for indicated testing frequency plus additional to accommodate PRN testing needs.    metoprolol   succinate (TOPROL  XL) 25 MG extended release tablet Take 0.5 tablets by mouth every morning (before breakfast)    tamsulosin  (FLOMAX ) 0.4 MG capsule Take 1 capsule by mouth daily    Multiple Vitamins-Minerals (THERAPEUTIC MULTIVITAMIN-MINERALS) tablet Take 1 tablet by mouth daily    insulin  glargine (LANTUS ) 100 UNIT/ML injection vial Inject 30 Units into the skin nightly    glucose monitoring (FREESTYLE FREEDOM) kit 1 kit by Does not apply route daily Can substitute for whichever glucometer is more affordable    insulin  aspart protamine-insulin  aspart (NOVOLOG  MIX 70/30) (70-30) 100 UNIT/ML injection Inject 15 Units into the skin 2 times daily (with meals)       Medical Decision Making   Patient overall alert and oriented x 3 ambulatory.  Will obtain lab work and imaging for further evaluation of patients complaint. Will continue to monitor and evaluate patient while in the ED.    I reviewed the vital signs, available nursing notes, past medical history, past surgical history, family history and social history.    Vital  Signs-Reviewed the patient's vital signs.    ED Course as of 06/14/24 0019   Thu Jun 13, 2024   1824 Initial patient pression at this time.  Patient states he has not had his insulin  for couple months he is homeless [facility] also says he needs his fluphenazine shot for his schizophrenia.  Patient denies nausea vomiting chest pain shortness of breath dizziness blurred vision syncope.  Patient also states he would like to speak with mental health.  Patient denies SI HI at time of my assessment and just states he has a lot going on. [CH]   1825 Physical exam rather unremarkable for acute medical findings at this time patient does appear disheveled with urine smell.  No abdominal pain to palpation patient ANO x 4 nontachypneic afebrile tachycardic at 104 [CH]   1830 CBC shows no leukocytosis no anemia [CH]   1830 Urinalysis micro shows evidence of UTI and trichomonas will treat [CH]   1844 Magnesium within normal limit venous pH within normal limits [CH]   1858 CMP shows dehydration glucose 564 no evidence of DKA anion gap unremarkable bicarb within normal limits [CH]   1859 Order 10 units regular insulin  at this time UDS unremarkable [CH]   1939 Ethanol unremarkable [CH]   2123 Will recheck sugar for medical clearance. [CH]   2301 POC glucose 150 will feed patient patient is medically clear will place telepsych consult per patient request at this time [CH]   2356 Per telepsychiatry they recommend admission patient is voluntary secondary to paranoia [CH]   2356 Patient's blood pressure noted to be 89/58 and remains tachycardic at 108 will reassess at this time [CH]   Fri Jun 14, 2024   0000 Reassess blood pressure and pulse at this time blood pressure 114/79 pulse 96 patient remains medically clear will consult be smart after telepsych recommends voluntary admission. [CH]      ED Course User Index  [CH] Ina Freund, PA-C       Dictation disclaimer:  Please note that this dictation was  completed with Dragon, the computer voice recognition software.  Quite often unanticipated grammatical, syntax, homophones, and other interpretive errors are inadvertently transcribed by the computer software.  Please disregard these errors.  Please excuse any errors that have escaped final proofreading.      Diagnosis     CLINICAL IMPRESSION:  1. Hyperglycemia    2. Paranoia (HCC)  Medication List        CONTINUE taking these medications      glucose monitoring kit  1 kit by Does not apply route daily Can substitute for whichever glucometer is more affordable     Lancets Misc  1 each by Does not apply route daily     metFORMIN  1000 MG tablet  Commonly known as: GLUCOPHAGE   Take 1 tablet by mouth 2 times daily (with meals)            ASK your doctor about these medications      blood glucose test strips  Test 3 times a day & as needed for symptoms of irregular blood glucose. Dispense sufficient amount for indicated testing frequency plus additional to accommodate PRN testing needs.     insulin  aspart protamine-insulin  aspart (70-30) 100 UNIT/ML injection  Commonly known as: NovoLOG  Mix 70/30  Inject 15 Units into the skin 2 times daily (with meals)     insulin  glargine 100 UNIT/ML injection vial  Commonly known as: Lantus   Inject 30 Units into the skin nightly     metoprolol  succinate 25 MG extended release tablet  Commonly known as: TOPROL  XL  Take 0.5 tablets by mouth every morning (before breakfast)     tamsulosin  0.4 MG capsule  Commonly known as: FLOMAX   Take 1 capsule by mouth daily     therapeutic multivitamin-minerals tablet  Take 1 tablet by mouth daily               Where to Get Your Medications        These medications were sent to [facility] - [place], [place] - [address] - P [phone] [facility] [phone]  [address] [place] [address]      Phone: [phone]   metFORMIN  1000 MG tablet         Disposition: Admit tele-psych    Patient condition at time of  disposition: Stable    [facility]e [facility] PA-C              [facility], [place]  06/14/24 0020

## 2024-06-13 NOTE — ED Provider Notes (Addendum)
 Co-management with APP: I have discussed this patient's evaluation and management with the APP and agree with their assessment and disposition plan. I have personally developed and approved the management plan.    HPI: The patient is a 69 year old male presenting due to lack of insulin  for the past few months, as he is homeless. He also requires a shot for his schizophrenia. His medical history includes diabetes and schizoaffective disorder.    Constitutional: Vital signs reviewed.    Exam: The patient appears disheveled and has a noticeable odor of urine. He is alert and oriented times four.    Study Interpretation (interpreted by me): I independently interpreted the urinalysis, which showed glucose >1000, moderate blood, and moderate leukocyte esterase. I independently interpreted the metabolic panel, which revealed elevated glucose at 564, decreased sodium at 129, and elevated creatinine at 1.34. I independently interpreted the CBC, which showed no anemia.    Physician Consultation: I discussed the care with the psychiatry team, who recommended voluntary admission due to paranoia.    Medical Decision Making: The patient is experiencing hyperglycemia with a glucose level of 564, likely due to lack of insulin . He was given insulin  and fluids to manage his blood sugar levels. Despite efforts to stabilize his glucose, the patient is refusing further blood work and is expressing a desire to leave. Given his psychiatric history and current paranoia, a voluntary admission is recommended for stabilization and management of his diabetes and mental health needs.    I performed a substantive portion of the visit, including all aspects of medical decision-making. My assessment is that the patient needs admission.         Corinthia Oneil LABOR, MD  06/14/24 0540       Corinthia Oneil LABOR, MD  06/15/24 [phone]

## 2024-06-14 LAB — POCT GLUCOSE
POC Glucose: 150 mg/dL — ABNORMAL HIGH (ref 70–110)
POC Glucose: 273 mg/dL — ABNORMAL HIGH (ref 70–110)
POC Glucose: 436 mg/dL (ref 70–110)
POC Glucose: 259 mg/dL — ABNORMAL HIGH (ref 70–110)
POC Glucose: 560 mg/dL (ref 70–110)

## 2024-06-14 LAB — ETHANOL: Ethanol Lvl: <11 mg/dL

## 2024-06-14 MED ORDER — SODIUM CHLORIDE 0.9 % IV BOLUS
0.9 | Freq: Once | INTRAVENOUS | Status: AC
Start: 2024-06-14 — End: 2024-06-14
  Administered 2024-06-14: 08:00:00 500 mL via INTRAVENOUS

## 2024-06-14 MED ORDER — INSULIN REGULAR HUMAN 100 UNIT/ML IJ SOLN
100 | INTRAMUSCULAR | Status: AC
Start: 2024-06-14 — End: 2024-06-13
  Administered 2024-06-14: 10 [IU] via SUBCUTANEOUS

## 2024-06-14 MED ORDER — FLUCONAZOLE 150 MG PO TABS
150 | ORAL | Status: DC
Start: 2024-06-14 — End: 2024-06-14

## 2024-06-14 MED FILL — HUMULIN R 100 UNIT/ML IJ SOLN: 100 [IU]/mL | INTRAMUSCULAR | Qty: 10

## 2024-06-14 MED FILL — METRONIDAZOLE 500 MG PO TABS: 500 mg | ORAL | Qty: 1

## 2024-06-14 MED FILL — SODIUM CHLORIDE 0.9 % IV SOLN: 0.9 % | INTRAVENOUS | Qty: 500 | Fill #0

## 2024-06-14 MED FILL — CEPHALEXIN 250 MG PO CAPS: 250 mg | ORAL | Qty: 2

## 2024-06-14 NOTE — BSMART Note (Signed)
 CRISIS NOTE:  Presented Patient for admission to [facility], to [facility] Provider, Victory Cramp, NP.  Pt denies SI, HI, AVH, and endorses paranoia. Pt glucose is not  controlled at this time.  Therefore, Patient is pended until 2 consecutive  test below 250, at least 2 hours apart.       Empress Newmann L. Evonna Stoltz, LPC, CSAC, CADC  [facility] Counselor

## 2024-06-14 NOTE — ED Notes (Signed)
 Pt became angry when told we needed to collect another blood sample, pt refused and said he will leave, pt said, You're not taking any more blood, give me my discharge papers
